# Patient Record
Sex: Female | Born: 1985 | Race: Black or African American | Hispanic: No | Marital: Single | Smoking: Never smoker
Health system: Southern US, Community
[De-identification: ages and names within clinical notes are randomized; demographics above are authoritative.]

## PROBLEM LIST (undated history)

## (undated) DIAGNOSIS — E0590 Thyrotoxicosis, unspecified without thyrotoxic crisis or storm: Secondary | ICD-10-CM

## (undated) HISTORY — DX: Thyrotoxicosis, unspecified without thyrotoxic crisis or storm: E05.90

---

## 2016-08-19 LAB — TSH: TSH: 0 — AB (ref <=5.90)

## 2016-09-23 ENCOUNTER — Encounter: Payer: Self-pay | Admitting: "Endocrinology

## 2016-09-23 ENCOUNTER — Ambulatory Visit (INDEPENDENT_AMBULATORY_CARE_PROVIDER_SITE_OTHER): Payer: BLUE CROSS/BLUE SHIELD | Admitting: "Endocrinology

## 2016-09-23 VITALS — Ht 62.0 in | Wt 148.0 lb

## 2016-09-23 DIAGNOSIS — E0590 Thyrotoxicosis, unspecified without thyrotoxic crisis or storm: Secondary | ICD-10-CM | POA: Diagnosis not present

## 2016-09-23 LAB — TSH: TSH: 0.01 m[IU]/L — AB

## 2016-09-23 LAB — T4, FREE: FREE T4: 1.8 ng/dL (ref 0.8–1.8)

## 2016-09-23 LAB — T3, FREE: T3, Free: 5.3 pg/mL — ABNORMAL HIGH (ref 2.3–4.2)

## 2016-09-23 NOTE — Progress Notes (Signed)
Subjective:    Patient ID: Kara Bruce, female    DOB: 1985/03/19, PCP Jonathon BellowsMcGee, Rachel, DO   Past Medical History:  Diagnosis Date  . Hyperthyroidism    No past surgical history on file. Social History   Social History  . Marital status: Single    Spouse name: N/A  . Number of children: N/A  . Years of education: N/A   Social History Main Topics  . Smoking status: Never Smoker  . Smokeless tobacco: Never Used  . Alcohol use No  . Drug use: No  . Sexual activity: Not Currently    Birth control/ protection: Abstinence   Other Topics Concern  . None   Social History Narrative  . None   Outpatient Encounter Prescriptions as of 09/23/2016  Medication Sig  . Cholecalciferol (VITAMIN D PO) Take by mouth once a week. Vitamin D 1.25mg  Take 1 tablet Once a week  . [DISCONTINUED] Cholecalciferol (VITAMIN D3 PO) Take by mouth.   No facility-administered encounter medications on file as of 09/23/2016.    ALLERGIES: No Known Allergies  VACCINATION STATUS:  There is no immunization history on file for this patient.  HPI 31 year old female patient with medical history as above. She is being seen in consultation for recent abnormal thyroid function tests suggesting hyperthyroidism requested by her PCP Jonathon BellowsMcGee, Rachel, DO. - She was undergoing a routine lab work when she had TSH undetectable, free T3 high at 3.73, free T4 normal at 1.37 on 08/19/2016. - She denies any  recent complaints. She denies weight change, tremors, palpitations, sleep disturbance, heat or cold intolerance. She has oriented of diaphoresis. She denies any family history of thyroid dysfunction.   Review of Systems  Constitutional: no weight gain/loss, no fatigue, no subjective hyperthermia, no subjective hypothermia Eyes: no blurry vision, no xerophthalmia ENT: no sore throat, no nodules palpated in throat, no dysphagia/odynophagia, no hoarseness Cardiovascular: no Chest Pain, no Shortness of Breath, no  palpitations, no leg swelling Respiratory: no cough, no SOB Gastrointestinal: no Nausea/Vomiting/Diarhhea Musculoskeletal: no muscle/joint aches Skin: no rashes Neurological: no tremors, no numbness, no tingling, no dizziness Psychiatric: no depression, no anxiety  Objective:    Ht 5\' 2"  (1.575 m)   Wt 148 lb (67.1 kg)   BMI 27.07 kg/m   Wt Readings from Last 3 Encounters:  09/23/16 148 lb (67.1 kg)    Physical Exam  Constitutional: + Slightly over weight for height, not in acute distress, normal state of mind Eyes: PERRLA, EOMI, no exophthalmos ENT: moist mucous membranes, no thyromegaly, no cervical lymphadenopathy Cardiovascular: normal precordial activity, Regular Rate and Rhythm, no Murmur/Rubs/Gallops Respiratory:  adequate breathing efforts, no gross chest deformity, Clear to auscultation bilaterally Gastrointestinal: abdomen soft, Non -tender, No distension, Bowel Sounds present Musculoskeletal: no gross deformities, strength intact in all four extremities Skin: moist, warm, no rashes Neurological: no tremor with outstretched hands, Deep tendon reflexes normal in all four extremities.    Assessment & Plan:   1. Hyperthyroidism - Patient is being seen at the kind request of her PCP Jonathon Bellowsachel McGee, D.O. - I have reviewed her available thyroid records and clinically evaluated this patient. She did have suppressed TSH and slightly elevated free T3 during a routine lab work approximately one month ago. Patient is not specifically symptomatic of clinical hyperthyroidism at this time, she is remarkably asymptomatic for the given suppression of TSH. -I will proceed to obtain new more complete set of thyroid function test today including TSH, free T4, free T3, antithyroid  antibodies. She will return in 1 week with results. If results are indicative of hyperthyroidism, she will be considered for thyroid uptake and scan. She does not have clinical goiter hence, I did not order any  imaging studies at this time.  - I advised patient to maintain close follow up with Jonathon Bellows, DO for primary care needs. Follow up plan: Return in about 1 week (around 09/30/2016) for labs today.  Marquis Lunch, MD Phone: [phone]  Fax: [phone]   09/23/2016, 2:12 PM

## 2016-09-24 LAB — THYROID PEROXIDASE ANTIBODY: THYROID PEROXIDASE ANTIBODY: 1 [IU]/mL (ref <9)

## 2016-09-24 LAB — THYROGLOBULIN ANTIBODY: Thyroglobulin Ab: <1 IU/mL (ref <2)

## 2016-10-03 ENCOUNTER — Ambulatory Visit (INDEPENDENT_AMBULATORY_CARE_PROVIDER_SITE_OTHER): Payer: BLUE CROSS/BLUE SHIELD | Admitting: "Endocrinology

## 2016-10-03 ENCOUNTER — Encounter: Payer: Self-pay | Admitting: "Endocrinology

## 2016-10-03 VITALS — BP 118/76 | HR 87 | Ht 62.0 in | Wt 151.0 lb

## 2016-10-03 DIAGNOSIS — E0590 Thyrotoxicosis, unspecified without thyrotoxic crisis or storm: Secondary | ICD-10-CM | POA: Diagnosis not present

## 2016-10-03 NOTE — Progress Notes (Signed)
Subjective:    Patient ID: Kara Bruce, female    DOB: [birth year], PCP Jonathon BellowsMcGee, Rachel, DO   Past Medical History:  Diagnosis Date  . Hyperthyroidism    No past surgical history on file. Social History   Social History  . Marital status: Single    Spouse name: N/A  . Number of children: N/A  . Years of education: N/A   Social History Main Topics  . Smoking status: Never Smoker  . Smokeless tobacco: Never Used  . Alcohol use No  . Drug use: No  . Sexual activity: Not Currently    Birth control/ protection: Abstinence   Other Topics Concern  . None   Social History Narrative  . None   Outpatient Encounter Prescriptions as of 10/03/2016  Medication Sig  . Cholecalciferol (VITAMIN D PO) Take by mouth once a week. Vitamin D 1.25mg  Take 1 tablet Once a week   No facility-administered encounter medications on file as of 10/03/2016.    ALLERGIES: No Known Allergies  VACCINATION STATUS:  There is no immunization history on file for this patient.  HPI 31 year old female patient with medical history as above. She is being seen in Follow-up for recent abnormal thyroid function tests suggesting hyperthyroidism. - She was sent for repeat thyroid function tests which show more evidence of hyperthyroidism with elevated free T3 at 5.3, free T4 at 1.8 and suppressed TSH of 0.01.  - She was undergoing a routine lab work when she had TSH undetectable, free T3 high at 3.73, free T4 normal at 1.37 on 08/19/2016. - She complains of mild heat intolerance.  She denies weight change, tremors, palpitations, sleep disturbance.  She denies any family history of thyroid dysfunction.   Review of Systems  Constitutional: + steady weight , no fatigue, no subjective hyperthermia, no subjective hypothermia Eyes: no blurry vision, no xerophthalmia ENT: no sore throat, no nodules palpated in throat, no dysphagia/odynophagia, no hoarseness Cardiovascular: no Chest Pain, no Shortness of  Breath, no palpitations, no leg swelling Respiratory: no cough, no SOB Gastrointestinal: no Nausea/Vomiting/Diarhhea Musculoskeletal: no muscle/joint aches Skin: no rashes Neurological: no tremors, no numbness, no tingling, no dizziness Psychiatric: no depression, no anxiety  Objective:    BP 118/76   Pulse 87   Ht 5\' 2"  (1.575 m)   Wt 151 lb (68.5 kg)   BMI 27.62 kg/m   Wt Readings from Last 3 Encounters:  10/03/16 151 lb (68.5 kg)  09/23/16 148 lb (67.1 kg)    Physical Exam  Constitutional: + Slightly over weight for height, not in acute distress, normal state of mind Eyes: PERRLA, EOMI, no exophthalmos ENT: moist mucous membranes, +Palpable thyromegaly, no cervical lymphadenopathy Cardiovascular: normal precordial activity, Regular Rate and Rhythm, no Murmur/Rubs/Gallops Respiratory:  adequate breathing efforts, no gross chest deformity, Clear to auscultation bilaterally Gastrointestinal: abdomen soft, Non -tender, No distension, Bowel Sounds present Musculoskeletal: no gross deformities, strength intact in all four extremities Skin: moist, warm, no rashes Neurological: no tremor with outstretched hands, Deep tendon reflexes normal in all four extremities.  Recent Results (from the past 2160 hour(s))  TSH     Status: Abnormal   Collection Time: 08/19/16 12:00 AM  Result Value Ref Range   TSH 0.00 (A) 0.41 - 5.90    Comment: Free T4 1.37 normal, free 3  3.73 slightly elevated.  TSH     Status: Abnormal   Collection Time: 09/23/16  2:25 PM  Result Value Ref Range   TSH 0.01 (L) mIU/L  Comment:   Reference Range   > or = 20 Years  0.40-4.50   Pregnancy Range First trimester  0.26-2.66 Second trimester 0.55-2.73 Third trimester  0.43-2.91     Thyroglobulin antibody     Status: None   Collection Time: 09/23/16  2:25 PM  Result Value Ref Range   Thyroglobulin Ab <1 <2 IU/mL  Thyroid peroxidase antibody     Status: None   Collection Time: 09/23/16  2:25 PM   Result Value Ref Range   Thyroperoxidase Ab SerPl-aCnc 1 <9 IU/mL  T4, free     Status: None   Collection Time: 09/23/16  2:25 PM  Result Value Ref Range   Free T4 1.8 0.8 - 1.8 ng/dL  T3, free     Status: Abnormal   Collection Time: 09/23/16  2:25 PM  Result Value Ref Range   T3, Free 5.3 (H) 2.3 - 4.2 pg/mL     Assessment & Plan:   1. Hyperthyroidism - Her repeat thyroid function tests are more suggestive of primary hyperthyroidism. - She will have  thyroid uptake and scan. - Web briefly discussed options of therapy. If she has significantly elevated uptake, she will be considered for I-131 thyroid ablation. - If uptake is not significant, she will be considered for low-dose Tapazole with close follow-up.  - I advised patient to maintain close follow up with Jonathon Bellows, DO for primary care needs. Follow up plan: Return in about 2 weeks (around 10/17/2016) for follow up with thyroid uptake and scan.  Marquis Lunch, MD Phone: [phone]  Fax: [phone]   10/03/2016, 3:17 PM

## 2016-10-14 ENCOUNTER — Encounter (HOSPITAL_COMMUNITY): Payer: Self-pay

## 2016-10-14 ENCOUNTER — Encounter (HOSPITAL_COMMUNITY)
Admission: RE | Admit: 2016-10-14 | Discharge: 2016-10-14 | Disposition: A | Discharge status: Home / Self Care | Payer: BLUE CROSS/BLUE SHIELD | Source: Ambulatory Visit | Attending: "Endocrinology | Admitting: "Endocrinology

## 2016-10-14 DIAGNOSIS — E0590 Thyrotoxicosis, unspecified without thyrotoxic crisis or storm: Secondary | ICD-10-CM | POA: Insufficient documentation

## 2016-10-14 MED ORDER — SODIUM IODIDE I-123 7.4 MBQ CAPS
400.0000 | ORAL_CAPSULE | Freq: Once | ORAL | Status: AC
  Administered 2016-10-14: 366 via ORAL

## 2016-10-15 ENCOUNTER — Encounter (HOSPITAL_COMMUNITY)
Admission: RE | Admit: 2016-10-15 | Discharge: 2016-10-15 | Disposition: A | Discharge status: Home / Self Care | Payer: BLUE CROSS/BLUE SHIELD | Source: Ambulatory Visit | Attending: "Endocrinology | Admitting: "Endocrinology

## 2016-10-15 DIAGNOSIS — E0590 Thyrotoxicosis, unspecified without thyrotoxic crisis or storm: Secondary | ICD-10-CM | POA: Diagnosis not present

## 2016-10-16 ENCOUNTER — Other Ambulatory Visit: Payer: Self-pay | Admitting: "Endocrinology

## 2016-10-16 ENCOUNTER — Ambulatory Visit: Payer: BLUE CROSS/BLUE SHIELD | Admitting: "Endocrinology

## 2016-10-16 DIAGNOSIS — E049 Nontoxic goiter, unspecified: Secondary | ICD-10-CM

## 2016-10-22 ENCOUNTER — Ambulatory Visit (HOSPITAL_COMMUNITY): Payer: BLUE CROSS/BLUE SHIELD

## 2016-10-29 ENCOUNTER — Ambulatory Visit (HOSPITAL_COMMUNITY)
Admission: RE | Admit: 2016-10-29 | Discharge: 2016-10-29 | Disposition: A | Discharge status: Home / Self Care | Payer: BLUE CROSS/BLUE SHIELD | Source: Ambulatory Visit | Attending: "Endocrinology | Admitting: "Endocrinology

## 2016-10-29 DIAGNOSIS — E049 Nontoxic goiter, unspecified: Secondary | ICD-10-CM | POA: Insufficient documentation

## 2016-11-12 ENCOUNTER — Ambulatory Visit (INDEPENDENT_AMBULATORY_CARE_PROVIDER_SITE_OTHER): Payer: BLUE CROSS/BLUE SHIELD | Admitting: "Endocrinology

## 2016-11-12 ENCOUNTER — Encounter: Payer: Self-pay | Admitting: "Endocrinology

## 2016-11-12 VITALS — BP 126/84 | HR 71 | Ht 62.0 in | Wt 147.0 lb

## 2016-11-12 DIAGNOSIS — E0590 Thyrotoxicosis, unspecified without thyrotoxic crisis or storm: Secondary | ICD-10-CM | POA: Diagnosis not present

## 2016-11-12 NOTE — Progress Notes (Signed)
Subjective:    Patient ID: Kara Bruce, female    DOB: [birth year], PCP Jonathon Bellows, DO   Past Medical History:  Diagnosis Date  . Hyperthyroidism    History reviewed. No pertinent surgical history. Social History   Social History  . Marital status: Single    Spouse name: N/A  . Number of children: N/A  . Years of education: N/A   Social History Main Topics  . Smoking status: Never Smoker  . Smokeless tobacco: Never Used  . Alcohol use No  . Drug use: No  . Sexual activity: Not Currently    Birth control/ protection: Abstinence   Other Topics Concern  . None   Social History Narrative  . None   Outpatient Encounter Prescriptions as of 11/12/2016  Medication Sig  . Cholecalciferol (VITAMIN D PO) Take by mouth once a week. Vitamin D 1.25mg  Take 1 tablet Once a week   No facility-administered encounter medications on file as of 11/12/2016.    ALLERGIES: No Known Allergies  VACCINATION STATUS:  There is no immunization history on file for this patient.  HPI 31 year old female patient with medical history as above. She is being seen in Follow-up for recent abnormal thyroid function tests suggesting hyperthyroidism. - She was sent for repeat thyroid function tests which show more evidence of hyperthyroidism with elevated free T3 at 5.3, free T4 at 1.8 and suppressed TSH of 0.01.  - She was undergoing a routine lab work when she had TSH undetectable, free T3 high at 3.73, free T4 normal at 1.37 on 08/19/2016. - She complains of mild heat intolerance. She lost 4 pounds since last visit. - Her thyroid uptake and scan was significant for 55% uptake and some subtle cold areas. Subsequent dedicated thyroid ultrasound did not reveal any nodular lesions.  She denies any family history of thyroid dysfunction.   Review of Systems  Constitutional: +  Weight loss , no fatigue, no subjective hyperthermia, no subjective hypothermia Eyes: no blurry vision, no  xerophthalmia ENT: no sore throat, no nodules palpated in throat, no dysphagia/odynophagia, no hoarseness Cardiovascular: no Chest Pain, no Shortness of Breath, no palpitations, no leg swelling Respiratory: no cough, no SOB Gastrointestinal: no Nausea/Vomiting/Diarhhea Musculoskeletal: no muscle/joint aches Skin: no rashes Neurological: no tremors, no numbness, no tingling, no dizziness Psychiatric: no depression, no anxiety  Objective:    BP 126/84   Pulse 71   Ht  (1.575 m)   Wt 147 lb (66.7 kg)   LMP 10/14/2016   BMI 26.89 kg/m   Wt Readings from Last 3 Encounters:  11/12/16 147 lb (66.7 kg)  10/03/16 151 lb (68.5 kg)  09/23/16 148 lb (67.1 kg)    Physical Exam  Constitutional: + Stable state of mind, not in acute distress, normal state of mind Eyes: PERRLA, EOMI, no exophthalmos ENT: moist mucous membranes, +Palpable thyromegaly, no cervical lymphadenopathy Cardiovascular: normal precordial activity, Regular Rate and Rhythm, no Murmur/Rubs/Gallops Respiratory:  adequate breathing efforts, no gross chest deformity, Clear to auscultation bilaterally Gastrointestinal: abdomen soft, Non -tender, No distension, Bowel Sounds present Musculoskeletal: no gross deformities, strength intact in all four extremities Skin: moist, warm, no rashes Neurological: no tremor with outstretched hands, Deep tendon reflexes normal in all four extremities.  Recent Results (from the past 2160 hour(s))  TSH     Status: Abnormal   Collection Time: 08/19/16 12:00 AM  Result Value Ref Range   TSH 0.00 (A) 0.41 - 5.90    Comment: Free T4 1.37  normal, free 3  3.73 slightly elevated.  TSH     Status: Abnormal   Collection Time: 09/23/16  2:25 PM  Result Value Ref Range   TSH 0.01 (L) mIU/L    Comment:   Reference Range   > or = 20 Years  0.40-4.50   Pregnancy Range First trimester  0.26-2.66 Second trimester 0.55-2.73 Third trimester  0.43-2.91     Thyroglobulin antibody     Status:  None   Collection Time: 09/23/16  2:25 PM  Result Value Ref Range   Thyroglobulin Ab <1 <2 IU/mL  Thyroid peroxidase antibody     Status: None   Collection Time: 09/23/16  2:25 PM  Result Value Ref Range   Thyroperoxidase Ab SerPl-aCnc 1 <9 IU/mL  T4, free     Status: None   Collection Time: 09/23/16  2:25 PM  Result Value Ref Range   Free T4 1.8 0.8 - 1.8 ng/dL  T3, free     Status: Abnormal   Collection Time: 09/23/16  2:25 PM  Result Value Ref Range   T3, Free 5.3 (H) 2.3 - 4.2 pg/mL     Assessment & Plan:   1. Hyperthyroidism - Her repeat thyroid function tests are more suggestive of primary hyperthyroidism, more suggestive of T3 toxicosis - Her thyroid uptake was at 55%. She has negative thyroid ultrasound for nodules. - We briefly discussed options of therapy. she will be considered for I-131 thyroid ablation. This treatment will be scheduled to be administered as soon as possible. - She understands the subsequent need for lifelong thyroid hormone replacement. - She would not need beta blockers at this time.  - I advised patient to maintain close follow up with Jonathon Bellows, DO for primary care needs. Follow up plan: Return in about 8 weeks (around 01/07/2017) for follow up with labs after I131 therapy.  Marquis Lunch, MD Phone: [phone]  Fax: [phone]   11/12/2016, 4:22 PM

## 2016-11-29 ENCOUNTER — Ambulatory Visit (HOSPITAL_COMMUNITY): Admission: RE | Admit: 2016-11-29 | Payer: BLUE CROSS/BLUE SHIELD | Source: Ambulatory Visit

## 2017-01-07 ENCOUNTER — Ambulatory Visit: Payer: BLUE CROSS/BLUE SHIELD | Admitting: "Endocrinology

## 2018-04-22 IMAGING — NM NM THYROID IMAGING W/ UPTAKE SINGLE (24 HR)
4 series · 4 of 4 positions shown · non-contrast
Comparison: None

CLINICAL DATA: Hyperthyroidism

EXAM:
THYROID SCAN AND UPTAKE - 24 HOURS
TECHNIQUE: Following the per oral administration of 8-DV8 sodium iodide, the
patient returned at 24 hours and uptake measurements were acquired
with the uptake probe centered on the neck. Thyroid imaging was also
performed.
RADIOPHARMACEUTICALS:  366 [employer] 8-DV8 sodium iodide orally

[Series 1: ant w marker · 1.18mm/px · 1 of 1 slices shown]
[im 1/1]
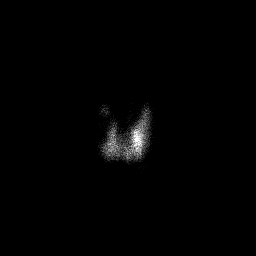

[Series 2: anterior · 1.18mm/px · 1 of 1 slices shown]
[im 1/1]
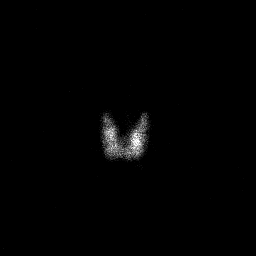

[Series 3: lao · 1.18mm/px · 1 of 1 slices shown]
[im 1/1]
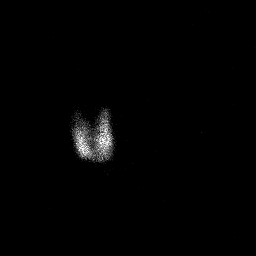

[Series 4: rao · 1.18mm/px · 1 of 1 slices shown]
[im 1/1]
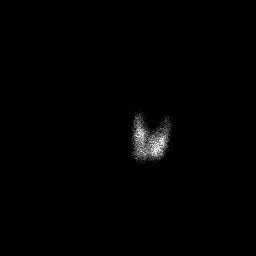

[4 of 4 positions shown; findings below may reference images not displayed]

FINDINGS: 24 hour radio iodine uptake is calculated at 55%, well above the
normal range consistent with hyperthyroidism.

Images of the thyroid gland in 3 projections demonstrate subtle cold
nodules at the upper and mid portions of the RIGHT thyroid lobe.

No definite focal areas of increased tracer localization seen.
IMPRESSION: Elevated 24 hour radio iodine uptakes consistent with
hyperthyroidism.

Subtle cold nodules at the upper and mid RIGHT thyroid lobe ;
thyroid sonography recommended to assess.

## 2018-11-18 IMAGING — US US THYROID
1 series · 13 of 25 positions shown · non-contrast
Comparison: Nuclear medicine thyroid uptake and scan - 10/14/2016

CLINICAL DATA: Incidental on other study. Cold nodule on nuclear
medicine thyroid scan. Hyperthyroidism.

EXAM:
THYROID ULTRASOUND
TECHNIQUE: Ultrasound examination of the thyroid gland and adjacent soft
tissues was performed.

[Series 1: us thyroid · 0.06mm/px · 13 of 62 slices shown]
[im 1/62]
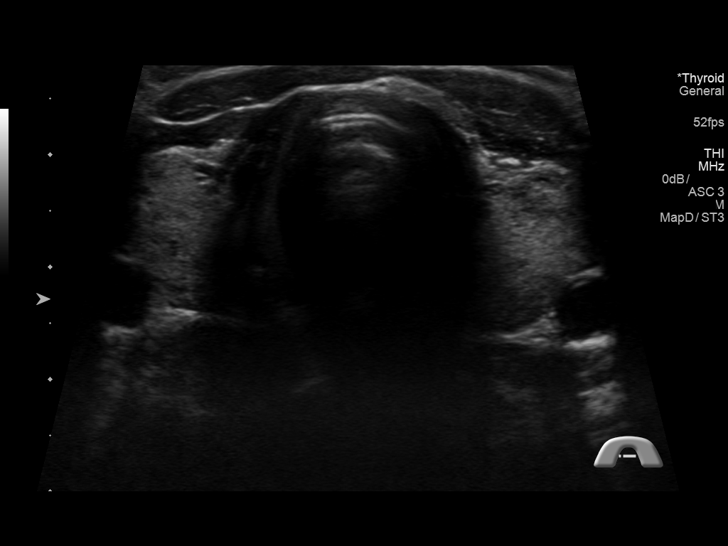
[im 6/62]
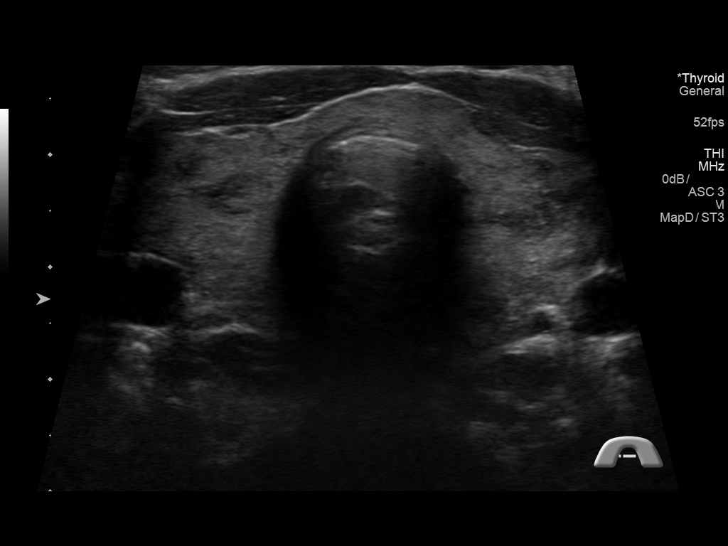
[im 11/62]
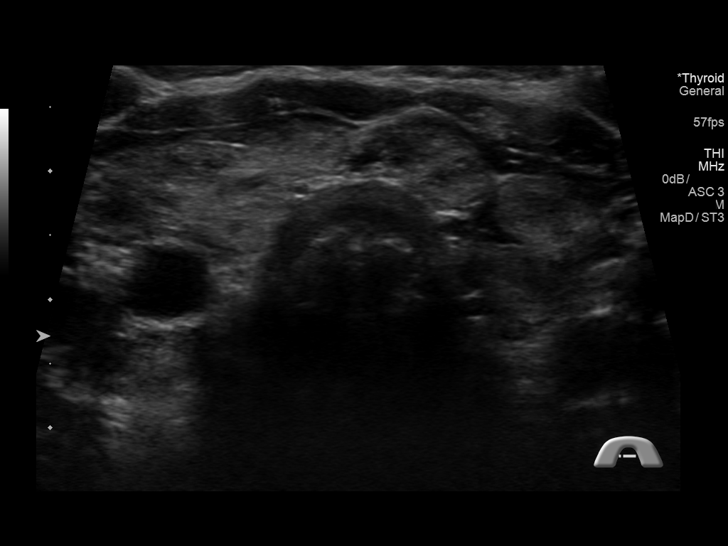
[im 16/62]
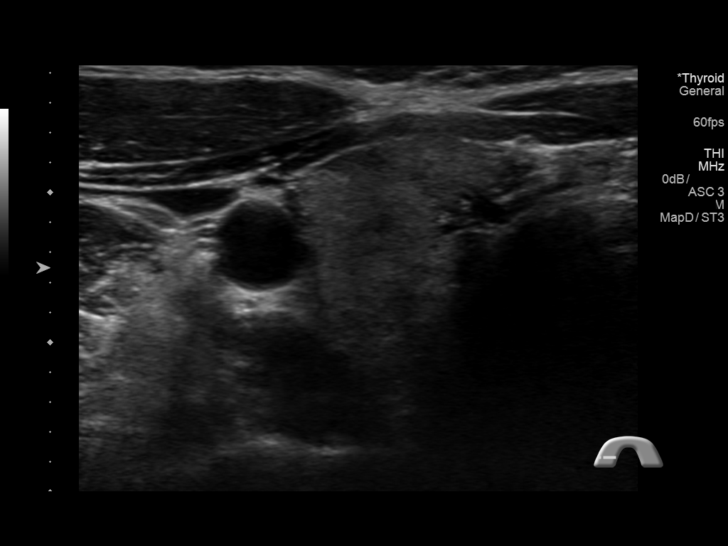
[im 21/62]
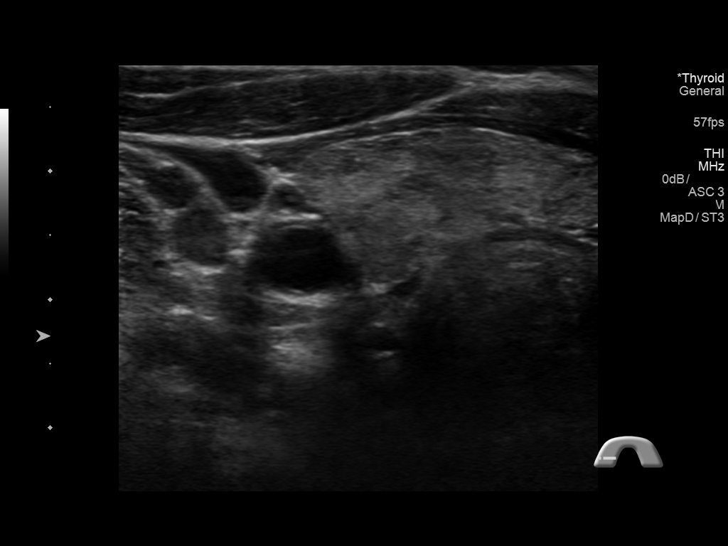
[im 26/62]
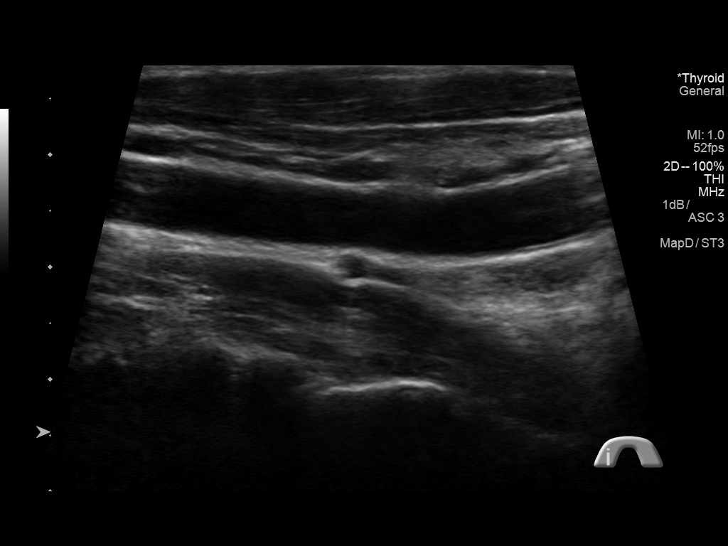
[im 31/62]
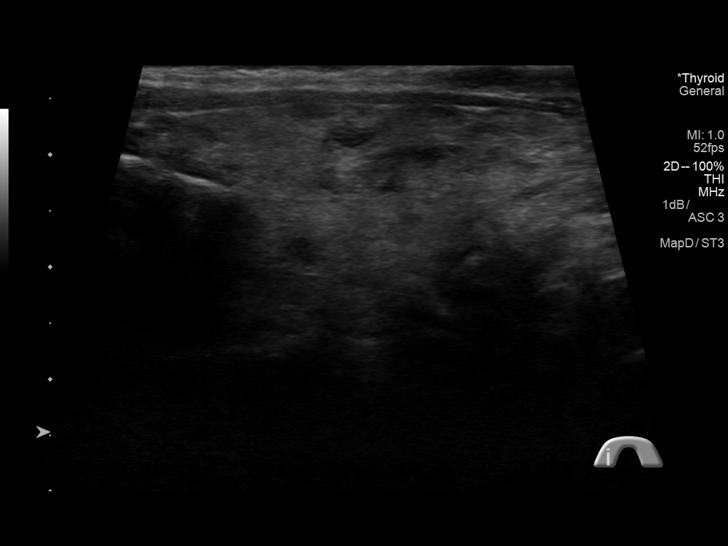
[im 36/62]
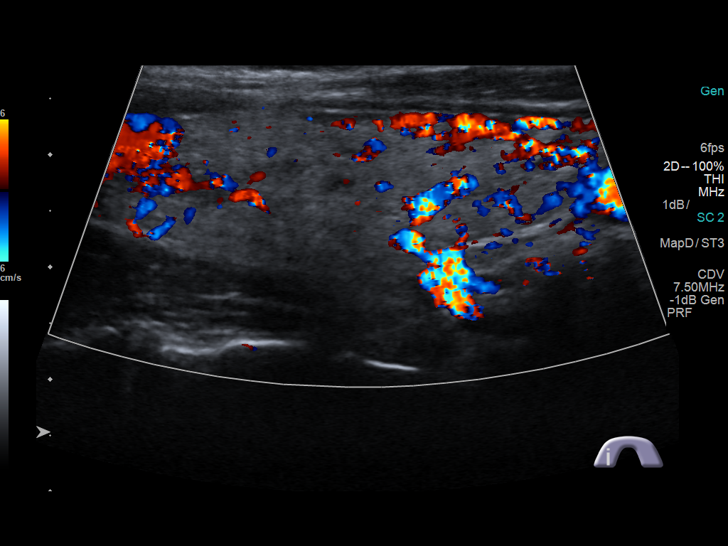
[im 41/62]
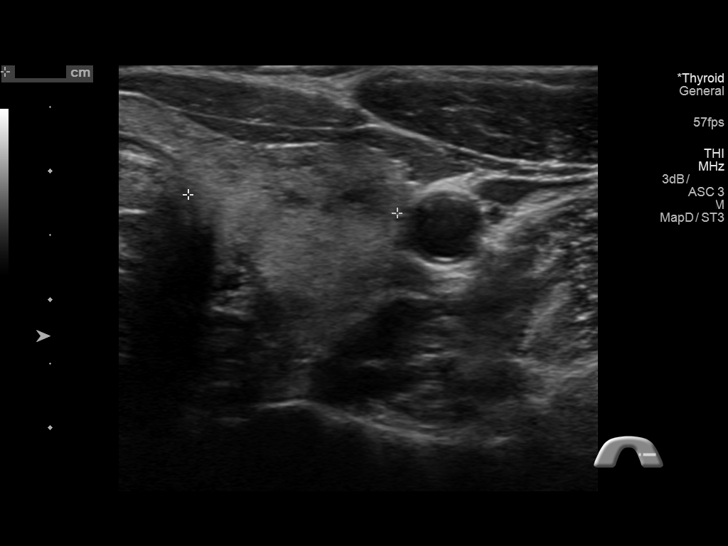
[im 46/62]
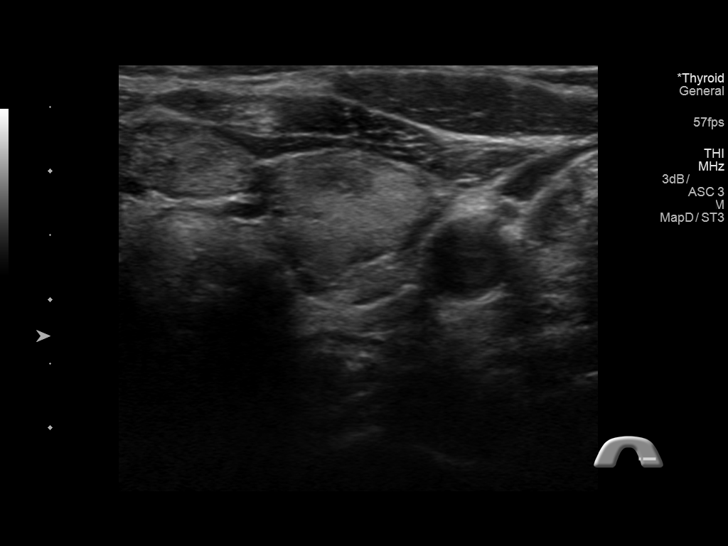
[im 51/62]
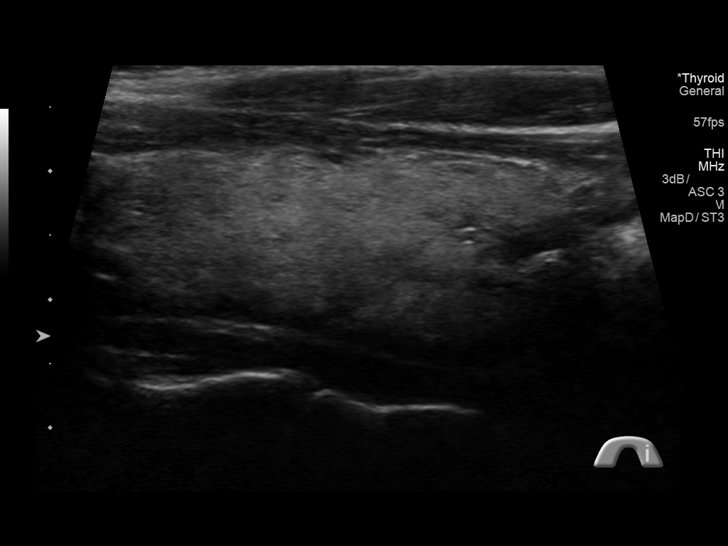
[im 56/62]
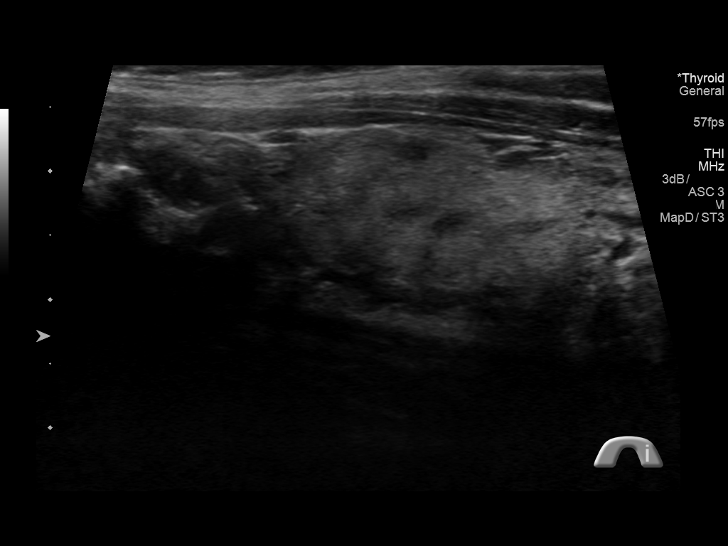
[im 62/62]
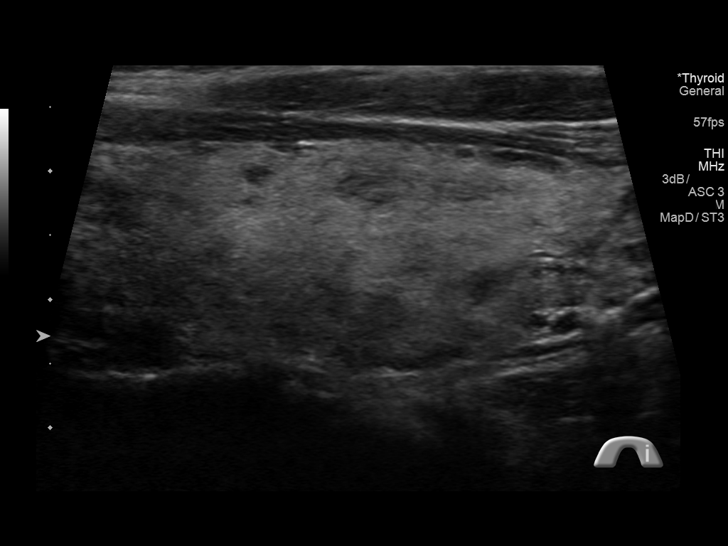

[13 of 25 positions shown; findings below may reference images not displayed]

FINDINGS: Parenchymal Echotexture: Markedly heterogenous - suspected diffuse
glandular hyperemia (images 13, 37 and 62).

Isthmus: Normal in size measures 0.3 cm in diameter

Right lobe: Normal in size measuring 4.9 x 2.0 x 1.6 cm

Left lobe: Normal in size measuring 5.0 x 1.8 x 1.6 cm

_________________________________________________________

Estimated total number of nodules >/= 1 cm: 0

Number of spongiform nodules >/=  2 cm not described below (TR1): 0

Number of mixed cystic and solid nodules >/= 1.5 cm not described
below (TR2): 0

_________________________________________________________

No discrete nodules are seen within the thyroid gland.
IMPRESSION: Marked heterogeneity of the thyroid gland without discrete nodule or
mass. Specifically, there is no definitive sonographic correlate
potential cold nodule within the superior/mid aspect of the right
lobe of the thyroid seen on preceding nuclear medicine thyroid
uptake and scan.

The above is in keeping with the ACR TI-RADS recommendations - [HOSPITAL] 6867;[DATE].

## 2019-03-31 LAB — LIPID PANEL
Cholesterol: 179 (ref 0–200)
HDL: 72 — AB (ref 35–70)
LDL Cholesterol: 92
Triglycerides: 89 (ref 40–160)

## 2019-03-31 LAB — TSH: TSH: 0.01 — AB (ref 0.41–5.90)

## 2019-03-31 LAB — COMPREHENSIVE METABOLIC PANEL: Calcium: 10.3 (ref 8.7–10.7)

## 2019-03-31 LAB — VITAMIN D 25 HYDROXY (VIT D DEFICIENCY, FRACTURES): Vit D, 25-Hydroxy: 6

## 2019-04-22 LAB — TSH: TSH: 0.01 — AB (ref 0.41–5.90)

## 2019-05-24 ENCOUNTER — Other Ambulatory Visit: Payer: Self-pay

## 2019-05-24 ENCOUNTER — Ambulatory Visit (INDEPENDENT_AMBULATORY_CARE_PROVIDER_SITE_OTHER): Payer: PRIVATE HEALTH INSURANCE | Admitting: "Endocrinology

## 2019-05-24 ENCOUNTER — Encounter: Payer: Self-pay | Admitting: "Endocrinology

## 2019-05-24 VITALS — BP 138/86 | HR 73 | Ht 63.0 in | Wt 165.0 lb

## 2019-05-24 DIAGNOSIS — E559 Vitamin D deficiency, unspecified: Secondary | ICD-10-CM | POA: Insufficient documentation

## 2019-05-24 DIAGNOSIS — E0590 Thyrotoxicosis, unspecified without thyrotoxic crisis or storm: Secondary | ICD-10-CM

## 2019-05-24 NOTE — Progress Notes (Signed)
05/24/2019 Endocrinology consult note.  Subjective:    Patient ID: Kara Bruce, female    DOB: [birth year], PCP Jonathon Bellows, DO   Past Medical History:  Diagnosis Date  . Hyperthyroidism    History reviewed. No pertinent surgical history. Social History   Socioeconomic History  . Marital status: Single    Spouse name: Not on file  . Number of children: Not on file  . Years of education: Not on file  . Highest education level: Not on file  Occupational History  . Not on file  Tobacco Use  . Smoking status: Never Smoker  . Smokeless tobacco: Never Used  Substance and Sexual Activity  . Alcohol use: No  . Drug use: No  . Sexual activity: Not Currently    Birth control/protection: Abstinence  Other Topics Concern  . Not on file  Social History Narrative  . Not on file   Social Determinants of Health   Financial Resource Strain:   . Difficulty of Paying Living Expenses:   Food Insecurity:   . Worried About Programme researcher, broadcasting/film/video in the Last Year:   . Barista in the Last Year:   Transportation Needs:   . Freight forwarder (Medical):   Marland Kitchen Lack of Transportation (Non-Medical):   Physical Activity:   . Days of Exercise per Week:   . Minutes of Exercise per Session:   Stress:   . Feeling of Stress :   Social Connections:   . Frequency of Communication with Friends and Family:   . Frequency of Social Gatherings with Friends and Family:   . Attends Religious Services:   . Active Member of Clubs or Organizations:   . Attends Banker Meetings:   Marland Kitchen Marital Status:    Outpatient Encounter Medications as of 05/24/2019  Medication Sig  . FERRETTS 325 (106 Fe) MG TABS tablet Take 1 tablet by mouth daily.  . methimazole (TAPAZOLE) 5 MG tablet Take 5 mg by mouth every 8 (eight) hours.  . [DISCONTINUED] Cholecalciferol (VITAMIN D PO) Take by mouth once a week. Vitamin D 1.25mg  Take 1 tablet Once a week   No facility-administered encounter  medications on file as of 05/24/2019.   ALLERGIES: No Known Allergies  VACCINATION STATUS:  There is no immunization history on file for this patient.  HPI 34 year old female patient with medical history as above. She was seen in this clinic 3 years ago in consult for hyperthyroidism.  Her work-up was significant for primary hyperthyroidism for which RAI treatment was advised and ordered.  However, patient did not complete this treatment and did not show up for follow-up visit.   She is rereferred due to the fact that her recent thyroid function tests from February and March revealed persistently suppressed TSH and high normal free T4. She was started on methimazole by her PCP .  She actually gained some weight since last visit, reports on and off palpitations.    Remarkably, she was only minimally symptomatic even before when her thyroid uptake and scan was elevated at 55%. -Her prior thyroid ultrasound did not reveal any nodular lesions.  She denies any family history of thyroid dysfunction.   Review of Systems  Constitutional: +  Weight gain , + fatigue, + subjective hyperthermia, no subjective hypothermia Eyes: no blurry vision, no xerophthalmia ENT: no sore throat, no nodules palpated in throat, no dysphagia/odynophagia, no hoarseness Cardiovascular: no Chest Pain, no Shortness of Breath, no palpitations, no leg swelling Respiratory: no cough,  no SOB Gastrointestinal: no Nausea/Vomiting/Diarhhea Musculoskeletal: no muscle/joint aches Skin: no rashes Neurological: no tremors, no numbness, no tingling, no dizziness Psychiatric: no depression, no anxiety  Objective:    BP 138/86   Pulse 73   Ht 5\' 3"  (1.6 m)   Wt 165 lb (74.8 kg)   BMI 29.23 kg/m   Wt Readings from Last 3 Encounters:  05/24/19 165 lb (74.8 kg)  11/12/16 147 lb (66.7 kg)  10/03/16 151 lb (68.5 kg)    Physical Exam  Physical Exam- Limited  Constitutional:  Body mass index is 29.23 kg/m. , not in  acute distress, normal state of mind Eyes:  EOMI, no exophthalmos Neck: Supple Thyroid: + Palpable thyromegaly.   Respiratory: Adequate breathing efforts Musculoskeletal: no gross deformities, strength intact in all four extremities, no gross restriction of joint movements Skin:  no rashes, no hyperemia Neurological: + tremor with outstretched hands,    Recent Results (from the past 2160 hour(s))  TSH     Status: Abnormal   Collection Time: 04/22/19 12:00 AM  Result Value Ref Range   TSH 0.01 (A) 0.41 - 5.90     Assessment & Plan:   1. Hyperthyroidism 2. Vitamin D Deficiency - Her presenting thyroid function tests are still consistent with hyperthyroidism.  Her prior thyroid uptake and scan is not evaluated any longer.  She is approached for confirmatory study with thyroid uptake and scan.  She will return in 7-10 days for treatment decision.  I briefly discussed treatment options including I-131 thyroid ablation if she returns with significant uptake.      - She understands the subsequent need for lifelong thyroid hormone replacement. -Her pulse rate is 73, did not require beta-blocker.    She would not need beta blockers at this time. She is advised to discontinue her methimazole for the scan and therapy with I 131 if necessary.  She will need vitamin D therapy with vitamin D2 50,000 units weekly for 12 weeks.  - I advised patient to maintain close follow up with Sherrilee Gilles, DO for primary care needs.  - Time spent on this patient care encounter:  35 min, of which >50% was spent in  counseling and the rest reviewing her  current and  previous labs/studies ( including abstraction from other facilities),  previous treatments,  and medications' doses and developing a plan for long-term care based on the latest recommendations for standards of care; and documenting her care.  Dalma Speelman participated in the discussions, expressed understanding, and voiced agreement with the  above plans.  All questions were answered to her satisfaction. she is encouraged to contact clinic should she have any questions or concerns prior to her return visit.  Follow up plan: Return in about 10 days (around 06/03/2019) for Follow up with Thyroid Uptake and Scan.  Glade Lloyd, MD Phone: [phone]  Fax: [phone]   05/24/2019, 8:05 PM

## 2019-05-25 MED ORDER — VITAMIN D (ERGOCALCIFEROL) 1.25 MG (50000 UNIT) PO CAPS: 50000.0000 [IU] | ORAL_CAPSULE | ORAL | 0 refills | Status: DC

## 2019-06-03 ENCOUNTER — Ambulatory Visit (HOSPITAL_COMMUNITY): Payer: Medicaid - Out of State

## 2019-06-03 ENCOUNTER — Other Ambulatory Visit (HOSPITAL_COMMUNITY): Payer: BLUE CROSS/BLUE SHIELD

## 2019-06-04 ENCOUNTER — Other Ambulatory Visit (HOSPITAL_COMMUNITY): Payer: BLUE CROSS/BLUE SHIELD

## 2019-06-08 ENCOUNTER — Encounter: Payer: Self-pay | Admitting: *Deleted

## 2019-06-08 ENCOUNTER — Ambulatory Visit: Payer: PRIVATE HEALTH INSURANCE | Admitting: "Endocrinology

## 2019-06-14 ENCOUNTER — Telehealth: Payer: Self-pay | Admitting: "Endocrinology

## 2019-06-14 NOTE — Telephone Encounter (Signed)
Pt called that she received a letter in the mail that she was scheduled for uptake and scan at [facility]. She said she was unaware of that because she was scheduled at [facility]. I called [facility] and canceled per pt request.

## 2019-06-25 NOTE — Telephone Encounter (Signed)
Will call pt's insurance for prior authorization.

## 2019-06-25 NOTE — Telephone Encounter (Signed)
[place] [facility] called and pt needs PA on this scan that is 5/11 of next week , her number is [phone] ext is [phone]

## 2019-06-29 ENCOUNTER — Encounter (HOSPITAL_COMMUNITY): Payer: PRIVATE HEALTH INSURANCE

## 2019-06-29 ENCOUNTER — Ambulatory Visit (HOSPITAL_COMMUNITY)
Admission: RE | Admit: 2019-06-29 | Discharge: 2019-06-29 | Disposition: A | Discharge status: Home / Self Care | Payer: PRIVATE HEALTH INSURANCE | Source: Ambulatory Visit | Attending: "Endocrinology | Admitting: "Endocrinology

## 2019-06-29 ENCOUNTER — Other Ambulatory Visit: Payer: Self-pay

## 2019-06-29 DIAGNOSIS — E0590 Thyrotoxicosis, unspecified without thyrotoxic crisis or storm: Secondary | ICD-10-CM

## 2019-06-29 MED ORDER — SODIUM IODIDE I-123 7.4 MBQ CAPS
452.0000 | ORAL_CAPSULE | Freq: Once | ORAL | Status: AC
  Administered 2019-06-29: 10:00:00 452 via ORAL

## 2019-06-30 ENCOUNTER — Encounter (HOSPITAL_COMMUNITY): Payer: PRIVATE HEALTH INSURANCE

## 2019-06-30 ENCOUNTER — Encounter (HOSPITAL_COMMUNITY)
Admission: RE | Admit: 2019-06-30 | Discharge: 2019-06-30 | Disposition: A | Discharge status: Home / Self Care | Payer: PRIVATE HEALTH INSURANCE | Source: Ambulatory Visit | Attending: "Endocrinology | Admitting: "Endocrinology

## 2019-07-05 ENCOUNTER — Encounter: Payer: Self-pay | Admitting: "Endocrinology

## 2019-07-05 ENCOUNTER — Telehealth (INDEPENDENT_AMBULATORY_CARE_PROVIDER_SITE_OTHER): Payer: PRIVATE HEALTH INSURANCE | Admitting: "Endocrinology

## 2019-07-05 DIAGNOSIS — E0500 Thyrotoxicosis with diffuse goiter without thyrotoxic crisis or storm: Secondary | ICD-10-CM | POA: Insufficient documentation

## 2019-07-05 DIAGNOSIS — E0590 Thyrotoxicosis, unspecified without thyrotoxic crisis or storm: Secondary | ICD-10-CM

## 2019-07-05 MED ORDER — PROPRANOLOL HCL 20 MG PO TABS: 20.0000 mg | ORAL_TABLET | Freq: Two times a day (BID) | ORAL | 2 refills | Status: DC

## 2019-07-05 NOTE — Progress Notes (Signed)
07/05/2019 Patient was engaged in follow-up for my chart video chat.                                Endocrinology Telehealth Visit Follow up Note -During COVID -19 Pandemic  I connected with Kara Bruce on 07/05/2019   by  video and verified that I am speaking with the correct person using two identifiers. Kara Bruce, Kara Bruce. she has verbally consented to this visit. All issues noted in this document were discussed and addressed. The format was not optimal for physical exam.   Subjective:    Patient ID: Kara Bruce, female    DOB: [birth year], PCP Kara Bruce, Kara Bruce   Past Medical History:  Diagnosis Date  . Hyperthyroidism    History reviewed. No pertinent surgical history. Social History   Socioeconomic History  . Marital status: Single    Spouse name: Not on file  . Number of children: Not on file  . Years of education: Not on file  . Highest education level: Not on file  Occupational History  . Not on file  Tobacco Use  . Smoking status: Never Smoker  . Smokeless tobacco: Never Used  Substance and Sexual Activity  . Alcohol use: No  . Drug use: No  . Sexual activity: Not Currently    Birth control/protection: Abstinence  Other Topics Concern  . Not on file  Social History Narrative  . Not on file   Social Determinants of Health   Financial Resource Strain:   . Difficulty of Paying Living Expenses:   Food Insecurity:   . Worried About Programme researcher, broadcasting/film/video in the Last Year:   . Barista in the Last Year:   Transportation Needs:   . Freight forwarder (Medical):   Marland Kitchen Lack of Transportation (Non-Medical):   Physical Activity:   . Days of Exercise per Week:   . Minutes of Exercise per Session:   Stress:   . Feeling of Stress :   Social Connections:   . Frequency of Communication with Friends and Family:   . Frequency of Social Gatherings with Friends and Family:   . Attends Religious Services:   . Active Member of Clubs or  Organizations:   . Attends Banker Meetings:   Marland Kitchen Marital Status:    Outpatient Encounter Medications as of 07/05/2019  Medication Sig  . FERRETTS 325 (106 Fe) MG TABS tablet Take 1 tablet by mouth daily.  . propranolol (INDERAL) 20 MG tablet Take 1 tablet (20 mg total) by mouth 2 (two) times daily.  . Vitamin D, Ergocalciferol, (DRISDOL) 1.25 MG (50000 UNIT) CAPS capsule Take 1 capsule (50,000 Units total) by mouth every 7 (seven) days.   No facility-administered encounter medications on file as of 07/05/2019.   ALLERGIES: No Known Allergies  VACCINATION STATUS:  There is no immunization history on file for this patient.  HPI 34 year old female patient with medical history as above. She was seen in this clinic 3 years ago in consult for hyperthyroidism.  Her work-up was significant for primary hyperthyroidism for which RAI treatment was advised and ordered.  However, patient did not complete this treatment and did not show up for follow-up until her most recent visit.  She was sent for repeat thyroid uptake and scan which confirms primary hyperthyroidism with Graves' disease.  She is now reporting palpitations on and off and heat intolerance.  She is not on  any antithyroid medication at this time.  Remarkably, she was only minimally symptomatic even before when her thyroid uptake and scan was elevated at 55%, her most recent uptake is 67% uniform uptake suggesting Graves' disease on Jun 30, 2019. -Her prior thyroid ultrasound did not reveal any nodular lesions.  She denies any family history of thyroid dysfunction.   Review of Systems  Limited as above.  Objective:    There were no vitals taken for this visit.  Wt Readings from Last 3 Encounters:  05/24/19 165 lb (74.8 kg)  11/12/16 147 lb (66.7 kg)  10/03/16 151 lb (68.5 kg)    Physical Exam    Recent Results (from the past 2160 hour(s))  TSH     Status: Abnormal   Collection Time: 04/22/19 12:00 AM  Result  Value Ref Range   TSH 0.01 (A) 0.41 - 5.90    Thyroid uptake and scan Jun 30, 2019 FINDINGS: Homogeneous tracer distribution in both thyroid lobes.  No focal areas of increased or decreased tracer localization seen.  Faint pyramidal lobe noted.  4 hour I-123 uptake = 45% (normal 5-20%)  24 hour I-123 uptake = 67% (normal 10-30%)  IMPRESSION: Elevated 4 hour and 24 hour radio iodine uptakes consistent with Hyperthyroidism.  Normal thyroid scan.  Findings consistent with Graves disease.  Assessment & Plan:   1. Hyperthyroidism 2.  Graves' disease  3.  Vitamin D Deficiency - Her presenting thyroid function tests and subsequent thyroid function tests are consistent with primary hyperthyroidism from Graves' disease.    She was previously treated with methimazole with suboptimal success.  I had a long discussion with her about the need for definitive treatment.  Options were discussed with her.  Her best option at this time with radioactive iodine thyroid ablation.  This treatment will be ordered to be administered as soon as possible in [facility].    - She understands the subsequent need for lifelong thyroid hormone replacement. -Due to her recent symptoms of palpitations and heat intolerance, she will be given a brief course of beta-blocker, propranolol 20 mg p.o. twice daily.   She is advised to stay away from methimazole. She was recently initiated on vitamin D therapy.  She is advised to continue V D2 50,000 units weekly for 12 weeks.  - I advised patient to maintain close follow up with Kara Bruce, Kara Bruce for primary care needs.     - Time spent on this patient care encounter:  25 minutes of which 50% was spent in  counseling and the rest reviewing  her current and  previous labs / studies and medications  doses and developing a plan for long term care. Kara Bruce  participated in the discussions, expressed understanding, and voiced agreement with the above  plans.  All questions were answered to her satisfaction. she is encouraged to contact clinic should she have any questions or concerns prior to her return visit.   Follow up plan: Return in about 9 weeks (around 09/06/2019) for F/U with Labs after I131 Therapy.  Glade Lloyd, MD Phone: [phone]  Fax: [phone]   07/05/2019, 6:27 PM

## 2019-07-13 ENCOUNTER — Other Ambulatory Visit: Payer: Self-pay

## 2019-07-13 ENCOUNTER — Ambulatory Visit: Payer: PRIVATE HEALTH INSURANCE | Admitting: "Endocrinology

## 2019-07-13 DIAGNOSIS — E0590 Thyrotoxicosis, unspecified without thyrotoxic crisis or storm: Secondary | ICD-10-CM

## 2019-07-15 ENCOUNTER — Encounter (HOSPITAL_COMMUNITY): Payer: Medicaid Other

## 2019-08-03 ENCOUNTER — Other Ambulatory Visit: Payer: Self-pay

## 2019-08-03 DIAGNOSIS — E0500 Thyrotoxicosis with diffuse goiter without thyrotoxic crisis or storm: Secondary | ICD-10-CM

## 2019-08-05 ENCOUNTER — Ambulatory Visit (HOSPITAL_COMMUNITY)
Admission: RE | Admit: 2019-08-05 | Discharge: 2019-08-05 | Disposition: A | Discharge status: Home / Self Care | Payer: PRIVATE HEALTH INSURANCE | Source: Ambulatory Visit | Attending: "Endocrinology | Admitting: "Endocrinology

## 2019-08-05 ENCOUNTER — Other Ambulatory Visit (HOSPITAL_COMMUNITY)
Admission: RE | Admit: 2019-08-05 | Discharge: 2019-08-05 | Disposition: A | Discharge status: Home / Self Care | Payer: PRIVATE HEALTH INSURANCE | Source: Ambulatory Visit | Attending: "Endocrinology | Admitting: "Endocrinology

## 2019-08-05 DIAGNOSIS — E0500 Thyrotoxicosis with diffuse goiter without thyrotoxic crisis or storm: Secondary | ICD-10-CM | POA: Insufficient documentation

## 2019-08-05 DIAGNOSIS — E0590 Thyrotoxicosis, unspecified without thyrotoxic crisis or storm: Secondary | ICD-10-CM | POA: Insufficient documentation

## 2019-08-05 LAB — HCG, SERUM, QUALITATIVE: Preg, Serum: NEGATIVE

## 2019-08-05 LAB — TSH: TSH: <0.01 u[IU]/mL — ABNORMAL LOW (ref 0.350–4.500)

## 2019-08-05 LAB — T4, FREE: Free T4: 1.44 ng/dL — ABNORMAL HIGH (ref 0.61–1.12)

## 2019-08-05 MED ORDER — SODIUM IODIDE I 131 CAPSULE
12.0000 | Freq: Once | INTRAVENOUS | Status: AC | PRN
  Administered 2019-08-05: 12 via ORAL

## 2019-09-08 ENCOUNTER — Ambulatory Visit: Payer: PRIVATE HEALTH INSURANCE | Admitting: "Endocrinology

## 2019-09-28 LAB — TSH: TSH: 0.01 — AB (ref 0.41–5.90)

## 2019-10-12 ENCOUNTER — Other Ambulatory Visit: Payer: Self-pay

## 2019-10-12 ENCOUNTER — Encounter: Payer: Self-pay | Admitting: "Endocrinology

## 2019-10-12 ENCOUNTER — Ambulatory Visit (INDEPENDENT_AMBULATORY_CARE_PROVIDER_SITE_OTHER): Payer: PRIVATE HEALTH INSURANCE | Admitting: "Endocrinology

## 2019-10-12 VITALS — BP 108/76 | HR 68 | Ht 63.0 in | Wt 164.8 lb

## 2019-10-12 DIAGNOSIS — E0590 Thyrotoxicosis, unspecified without thyrotoxic crisis or storm: Secondary | ICD-10-CM | POA: Diagnosis not present

## 2019-10-12 NOTE — Progress Notes (Signed)
10/12/2019 Endocrinology consult note.  Subjective:    Patient ID: Kara Bruce, female    DOB: [birth year], PCP Jonathon Bellows, DO   Past Medical History:  Diagnosis Date  . Hyperthyroidism    History reviewed. No pertinent surgical history. Social History   Socioeconomic History  . Marital status: Single    Spouse name: Not on file  . Number of children: Not on file  . Years of education: Not on file  . Highest education level: Not on file  Occupational History  . Not on file  Tobacco Use  . Smoking status: Never Smoker  . Smokeless tobacco: Never Used  Vaping Use  . Vaping Use: Never used  Substance and Sexual Activity  . Alcohol use: No  . Drug use: No  . Sexual activity: Not Currently    Birth control/protection: Abstinence  Other Topics Concern  . Not on file  Social History Narrative  . Not on file   Social Determinants of Health   Financial Resource Strain:   . Difficulty of Paying Living Expenses: Not on file  Food Insecurity:   . Worried About Programme researcher, broadcasting/film/video in the Last Year: Not on file  . Ran Out of Food in the Last Year: Not on file  Transportation Needs:   . Lack of Transportation (Medical): Not on file  . Lack of Transportation (Non-Medical): Not on file  Physical Activity:   . Days of Exercise per Week: Not on file  . Minutes of Exercise per Session: Not on file  Stress:   . Feeling of Stress : Not on file  Social Connections:   . Frequency of Communication with Friends and Family: Not on file  . Frequency of Social Gatherings with Friends and Family: Not on file  . Attends Religious Services: Not on file  . Active Member of Clubs or Organizations: Not on file  . Attends Banker Meetings: Not on file  . Marital Status: Not on file   Outpatient Encounter Medications as of 10/12/2019  Medication Sig  . FERRETTS 325 (106 Fe) MG TABS tablet Take 1 tablet by mouth daily.  . Vitamin D, Ergocalciferol, (DRISDOL) 1.25 MG  (50000 UNIT) CAPS capsule Take 1 capsule (50,000 Units total) by mouth every 7 (seven) days.  . [DISCONTINUED] propranolol (INDERAL) 20 MG tablet Take 1 tablet (20 mg total) by mouth 2 (two) times daily.   No facility-administered encounter medications on file as of 10/12/2019.   ALLERGIES: No Known Allergies  VACCINATION STATUS:  There is no immunization history on file for this patient.  HPI 34 year old female patient with medical history as above. She is dealt with hyperthyroidism from Graves' disease for 3+ years. During her last visit, she agreed and was sent for radioactive iodine ablation. She received this treatment on August 05, 2019. She underwent post therapy thyroid function test on September 28, 2019. Labs are consistent with treatment effect, however no hypothyroidism yet. She remains on propanolol, not on methimazole. She reports feeling better, regaining some of the weight she lost when she was hyperthyroid.  She denies palpitations, tremors, heat/cold intolerance.   Remarkably, she was only minimally symptomatic even before when her thyroid uptake and scan was elevated at 55%. -Her prior thyroid ultrasound did not reveal any nodular lesions.  She denies any family history of thyroid dysfunction.   Review of Systems  Constitutional: +  Weight gain , + fatigue, + subjective hyperthermia, no subjective hypothermia Eyes: no blurry vision, no xerophthalmia ENT:  no sore throat, no nodules palpated in throat, no dysphagia/odynophagia, no hoarseness Cardiovascular: no Chest Pain, no Shortness of Breath, no palpitations, no leg swelling Respiratory: no cough, no SOB Gastrointestinal: no Nausea/Vomiting/Diarhhea Musculoskeletal: no muscle/joint aches Skin: no rashes Neurological: no tremors, no numbness, no tingling, no dizziness Psychiatric: no depression, no anxiety  Objective:    BP 108/76   Pulse 68   Ht 5\' 3"  (1.6 m)   Wt 164 lb 12.8 oz (74.8 kg)   BMI 29.19 kg/m   Wt  Readings from Last 3 Encounters:  10/12/19 164 lb 12.8 oz (74.8 kg)  05/24/19 165 lb (74.8 kg)  11/12/16 147 lb (66.7 kg)     Physical Exam- Limited  Constitutional:  Body mass index is 29.19 kg/m. , not in acute distress, normal state of mind Eyes:  EOMI, no exophthalmos Neck: Supple Thyroid: No gross goiter Respiratory: Adequate breathing efforts Musculoskeletal: no gross deformities, strength intact in all four extremities, no gross restriction of joint movements Skin:  no rashes, no hyperemia Neurological: no tremor with outstretched hands    Recent Results (from the past 2160 hour(s))  hCG, serum, qualitative     Status: None   Collection Time: 08/05/19  9:05 AM  Result Value Ref Range   Preg, Serum NEGATIVE NEGATIVE    Comment:        THE SENSITIVITY OF THIS METHODOLOGY IS >10 mIU/mL. Performed at [facility], [address] [place], [place]   T4, free     Status: Abnormal   Collection Time: 08/05/19  9:05 AM  Result Value Ref Range   Free T4 1.44 (H) 0.61 - 1.12 ng/dL    Comment: (NOTE) Biotin ingestion may interfere with free T4 tests. If the results are inconsistent with the TSH level, previous test results, or the clinical presentation, then consider biotin interference. If needed, order repeat testing after stopping biotin. Performed at [facility], [address] [place], [place]   TSH     Status: Abnormal   Collection Time: 08/05/19  9:05 AM  Result Value Ref Range   TSH <0.010 (L) 0.350 - 4.500 uIU/mL    Comment: Performed at [facility], [address] [place], [place]     Assessment & Plan:   1. Hyperthyroidism 2. Vitamin D Deficiency -She presents with treatment effect with clinical improvement, status post radioactive iodine ablation on August 05, 2019. Her previsit labs are not consistent with hypothyroidism yet. She would not be initiated on thyroid hormone replacement today.    - She understands the high  likelihood of subsequent hypothyroidism and need for lifelong thyroid hormone replacement. -Her pulse rate is 68, she is advised to discontinue her propanolol.   She is advised to continue  vitamin D therapy with vitamin D2 50,000 units weekly for 12 weeks.  - I advised patient to maintain close follow up with August 07, 2019, DO for primary care needs.     - Time spent on this patient care encounter:  20 minutes of which 50% was spent in  counseling and the rest reviewing  her current and  previous labs / studies and medications  doses and developing a plan for long term care. Aquita Scarberry  participated in the discussions, expressed understanding, and voiced agreement with the above plans.  All questions were answered to her satisfaction. she is encouraged to contact clinic should she have any questions or concerns prior to her return visit.   Follow up  plan: Return in about 5 weeks (around 11/16/2019) for F/U with Pre-visit Labs.  Marquis Lunch, MD Phone: [phone]  Fax: [phone]   10/12/2019, 9:02 AM

## 2019-11-12 LAB — T4, FREE: Free T4: 0.84 ng/dL (ref 0.82–1.77)

## 2019-11-12 LAB — TSH: TSH: 0.026 u[IU]/mL — ABNORMAL LOW (ref 0.450–4.500)

## 2019-11-12 LAB — T3, FREE: T3, Free: 2.2 pg/mL (ref 2.0–4.4)

## 2019-11-16 ENCOUNTER — Other Ambulatory Visit: Payer: Self-pay

## 2019-11-16 ENCOUNTER — Ambulatory Visit (INDEPENDENT_AMBULATORY_CARE_PROVIDER_SITE_OTHER): Payer: PRIVATE HEALTH INSURANCE | Admitting: "Endocrinology

## 2019-11-16 ENCOUNTER — Encounter: Payer: Self-pay | Admitting: "Endocrinology

## 2019-11-16 VITALS — BP 116/82 | HR 64 | Ht 63.0 in | Wt 167.8 lb

## 2019-11-16 DIAGNOSIS — E0590 Thyrotoxicosis, unspecified without thyrotoxic crisis or storm: Secondary | ICD-10-CM | POA: Diagnosis not present

## 2019-11-16 NOTE — Progress Notes (Signed)
11/16/2019 Endocrinology consult note.  Subjective:    Patient ID: Kara Bruce, female    DOB: 09/24/1985, PCP Jonathon Bellows, DO   Past Medical History:  Diagnosis Date  . Hyperthyroidism    History reviewed. No pertinent surgical history. Social History   Socioeconomic History  . Marital status: Single    Spouse name: Not on file  . Number of children: Not on file  . Years of education: Not on file  . Highest education level: Not on file  Occupational History  . Not on file  Tobacco Use  . Smoking status: Never Smoker  . Smokeless tobacco: Never Used  Vaping Use  . Vaping Use: Never used  Substance and Sexual Activity  . Alcohol use: No  . Drug use: No  . Sexual activity: Not Currently    Birth control/protection: Abstinence  Other Topics Concern  . Not on file  Social History Narrative  . Not on file   Social Determinants of Health   Financial Resource Strain:   . Difficulty of Paying Living Expenses: Not on file  Food Insecurity:   . Worried About Programme researcher, broadcasting/film/video in the Last Year: Not on file  . Ran Out of Food in the Last Year: Not on file  Transportation Needs:   . Lack of Transportation (Medical): Not on file  . Lack of Transportation (Non-Medical): Not on file  Physical Activity:   . Days of Exercise per Week: Not on file  . Minutes of Exercise per Session: Not on file  Stress:   . Feeling of Stress : Not on file  Social Connections:   . Frequency of Communication with Friends and Family: Not on file  . Frequency of Social Gatherings with Friends and Family: Not on file  . Attends Religious Services: Not on file  . Active Member of Clubs or Organizations: Not on file  . Attends Banker Meetings: Not on file  . Marital Status: Not on file   Outpatient Encounter Medications as of 11/16/2019  Medication Sig  . FERRETTS 325 (106 Fe) MG TABS tablet Take 1 tablet by mouth daily.  . Vitamin D, Ergocalciferol, (DRISDOL) 1.25 MG  (50000 UNIT) CAPS capsule Take 1 capsule (50,000 Units total) by mouth every 7 (seven) days.   No facility-administered encounter medications on file as of 11/16/2019.   ALLERGIES: No Known Allergies  VACCINATION STATUS:  There is no immunization history on file for this patient.  HPI 34 year old female patient with medical history as above. She is here to follow-up with repeat thyroid function tests.  She has dealt with hyperthyroidism from Graves' disease for 3+ years. During her last visit, she agreed and was sent for radioactive iodine ablation. She received this treatment on August 05, 2019. She underwent post therapy thyroid function test on September 28, 2019. Labs are consistent with treatment effect, however no hypothyroidism yet. She remains off of beta-blocker.   She reports feeling better, regaining some of the weight she lost when she was hyperthyroid.  She denies palpitations, tremors, heat/cold intolerance.   Remarkably, she was only minimally symptomatic even before when her thyroid uptake and scan was elevated at 55%. -Her prior thyroid ultrasound did not reveal any nodular lesions.  She denies any family history of thyroid dysfunction.   Review of Systems  Weight gain of 3 pounds since last visit.  Objective:    BP 116/82   Pulse 64   Ht 5\' 3"  (1.6 m)   Wt 167  lb 12.8 oz (76.1 kg)   BMI 29.72 kg/m   Wt Readings from Last 3 Encounters:  11/16/19 167 lb 12.8 oz (76.1 kg)  10/12/19 164 lb 12.8 oz (74.8 kg)  05/24/19 165 lb (74.8 kg)     Physical Exam- Limited  Constitutional:  Body mass index is 29.72 kg/m. , not in acute distress, normal state of mind Eyes:  EOMI, no exophthalmos Neck: Supple Thyroid: No gross goiter Respiratory: Adequate breathing efforts Musculoskeletal: no gross deformities, strength intact in all four extremities, no gross restriction of joint movements Skin:  no rashes, no hyperemia Neurological: no tremor with outstretched  hands     Recent Results (from the past 2160 hour(s))  TSH     Status: Abnormal   Collection Time: 09/28/19 12:00 AM  Result Value Ref Range   TSH 0.01 (A) 0.41 - 5.90    Comment: free t4 1.68 ( normal 0.45- 1.63)  TSH     Status: Abnormal   Collection Time: 11/11/19  8:27 AM  Result Value Ref Range   TSH 0.026 (L) 0.450 - 4.500 uIU/mL  T4, free     Status: None   Collection Time: 11/11/19  8:27 AM  Result Value Ref Range   Free T4 0.84 0.82 - 1.77 ng/dL  T3, free     Status: None   Collection Time: 11/11/19  8:27 AM  Result Value Ref Range   T3, Free 2.2 2.0 - 4.4 pg/mL     Assessment & Plan:   1. Hyperthyroidism 2. Vitamin D Deficiency -She presents with treatment effect with clinical improvement, status post radioactive iodine ablation on August 05, 2019. Her previsit labs are consistent with treatment effect, however she is not hypothyroid yet.   She will not be initiated on thyroid hormone supplement today.  She will have repeat thyroid function test in 3 weeks and phone visit in 4 weeks.    - She understands the high likelihood of subsequent hypothyroidism and need for lifelong thyroid hormone replacement. -Her pulse rate is 64, she is advised to stay off of beta-blocker.    -She is advised to continue vitamin D2 supplement with vitamin D2 50,000 units weekly x12 weeks.     - I advised patient to maintain close follow up with Jonathon Bellows, DO for primary care needs.      - Time spent on this patient care encounter:  20 minutes of which 50% was spent in  counseling and the rest reviewing  her current and  previous labs / studies and medications  doses and developing a plan for long term care. Talayeh Blas  participated in the discussions, expressed understanding, and voiced agreement with the above plans.  All questions were answered to her satisfaction. she is encouraged to contact clinic should she have any questions or concerns prior to her return  visit.   Follow up plan: Return in about 4 weeks (around 12/14/2019), or Phone, for F/U with Pre-visit Labs.  Marquis Lunch, MD Phone: [phone]  Fax: [phone]   11/16/2019, 9:26 AM

## 2019-12-09 ENCOUNTER — Telehealth (INDEPENDENT_AMBULATORY_CARE_PROVIDER_SITE_OTHER): Payer: PRIVATE HEALTH INSURANCE | Admitting: "Endocrinology

## 2019-12-09 ENCOUNTER — Encounter: Payer: Self-pay | Admitting: "Endocrinology

## 2019-12-09 DIAGNOSIS — E890 Postprocedural hypothyroidism: Secondary | ICD-10-CM

## 2019-12-09 LAB — TSH: TSH: 24.5 u[IU]/mL — ABNORMAL HIGH (ref 0.450–4.500)

## 2019-12-09 LAB — T4, FREE: Free T4: 0.29 ng/dL — ABNORMAL LOW (ref 0.82–1.77)

## 2019-12-09 LAB — T3, FREE: T3, Free: 0.9 pg/mL — ABNORMAL LOW (ref 2.0–4.4)

## 2019-12-09 MED ORDER — LEVOTHYROXINE SODIUM 75 MCG PO TABS: 75.0000 ug | ORAL_TABLET | Freq: Every day | ORAL | 1 refills | Status: DC

## 2019-12-09 NOTE — Progress Notes (Signed)
12/09/2019   Subjective:    Patient ID: Kara Bruce, female    DOB: 08-Mar-1985, PCP Jonathon Bellows, DO   Past Medical History:  Diagnosis Date  . Hyperthyroidism    History reviewed. No pertinent surgical history. Social History   Socioeconomic History  . Marital status: Single    Spouse name: Not on file  . Number of children: Not on file  . Years of education: Not on file  . Highest education level: Not on file  Occupational History  . Not on file  Tobacco Use  . Smoking status: Never Smoker  . Smokeless tobacco: Never Used  Vaping Use  . Vaping Use: Never used  Substance and Sexual Activity  . Alcohol use: No  . Drug use: No  . Sexual activity: Not Currently    Birth control/protection: Abstinence  Other Topics Concern  . Not on file  Social History Narrative  . Not on file   Social Determinants of Health   Financial Resource Strain:   . Difficulty of Paying Living Expenses: Not on file  Food Insecurity:   . Worried About Programme researcher, broadcasting/film/video in the Last Year: Not on file  . Ran Out of Food in the Last Year: Not on file  Transportation Needs:   . Lack of Transportation (Medical): Not on file  . Lack of Transportation (Non-Medical): Not on file  Physical Activity:   . Days of Exercise per Week: Not on file  . Minutes of Exercise per Session: Not on file  Stress:   . Feeling of Stress : Not on file  Social Connections:   . Frequency of Communication with Friends and Family: Not on file  . Frequency of Social Gatherings with Friends and Family: Not on file  . Attends Religious Services: Not on file  . Active Member of Clubs or Organizations: Not on file  . Attends Banker Meetings: Not on file  . Marital Status: Not on file   Outpatient Encounter Medications as of 12/09/2019  Medication Sig  . FERRETTS 325 (106 Fe) MG TABS tablet Take 1 tablet by mouth daily.  Marland Kitchen levothyroxine (SYNTHROID) 75 MCG tablet Take 1 tablet (75 mcg total) by  mouth daily before breakfast.  . Vitamin D, Ergocalciferol, (DRISDOL) 1.25 MG (50000 UNIT) CAPS capsule Take 1 capsule (50,000 Units total) by mouth every 7 (seven) days.   No facility-administered encounter medications on file as of 12/09/2019.   ALLERGIES: No Known Allergies  VACCINATION STATUS:  There is no immunization history on file for this patient.  HPI 34 year old female patient with medical history as above. She is being engaged in follow-up after she was treated with radioactive iodine treatment for Graves' disease. She has dealt with hyperthyroidism from Graves' disease for 3+ years.  She received I-131 thyroid ablation treatment on August 05, 2019.   Her previsit labs are now consistent with treatment effect and at hypothyroid range.  -She reports cold intolerance.  She has progressive weight gain. -Her prior thyroid ultrasound did not reveal any nodular lesions.  She denies any family history of thyroid dysfunction.   Review of Systems  Weight gain of 3 pounds since last visit.  Objective:    There were no vitals taken for this visit.  Wt Readings from Last 3 Encounters:  11/16/19 167 lb 12.8 oz (76.1 kg)  10/12/19 164 lb 12.8 oz (74.8 kg)  05/24/19 165 lb (74.8 kg)     Recent Results (from the past 2160 hour(s))  TSH     Status: Abnormal   Collection Time: 09/28/19 12:00 AM  Result Value Ref Range   TSH 0.01 (A) 0.41 - 5.90    Comment: free t4 1.68 ( normal 0.45- 1.63)  TSH     Status: Abnormal   Collection Time: 11/11/19  8:27 AM  Result Value Ref Range   TSH 0.026 (L) 0.450 - 4.500 uIU/mL  T4, free     Status: None   Collection Time: 11/11/19  8:27 AM  Result Value Ref Range   Free T4 0.84 0.82 - 1.77 ng/dL  T3, free     Status: None   Collection Time: 11/11/19  8:27 AM  Result Value Ref Range   T3, Free 2.2 2.0 - 4.4 pg/mL  TSH     Status: Abnormal   Collection Time: 12/08/19  8:18 AM  Result Value Ref Range   TSH 24.500 (H) 0.450 - 4.500 uIU/mL   T4, free     Status: Abnormal   Collection Time: 12/08/19  8:18 AM  Result Value Ref Range   Free T4 0.29 (L) 0.82 - 1.77 ng/dL  T3, free     Status: Abnormal   Collection Time: 12/08/19  8:18 AM  Result Value Ref Range   T3, Free 0.9 (L) 2.0 - 4.4 pg/mL     Assessment & Plan:   1.  RAI induced hypothyroidism 2.  Vitamin D Deficiency -She is status post radioactive iodine ablation on August 05, 2019. -She presents with treatment effect with hypothyroidism.  She is ready for thyroid hormone supplement.  I discussed and initiated levothyroxine 75 mcg p.o. daily before breakfast.     - We discussed about the correct intake of her thyroid hormone, on empty stomach at fasting, with water, separated by at least 30 minutes from breakfast and other medications,  and separated by more than 4 hours from calcium, iron, multivitamins, acid reflux medications (PPIs). -Patient is made aware of the fact that thyroid hormone replacement is needed for life, dose to be adjusted by periodic monitoring of thyroid function tests. -Her pulse rate is 64, she is advised to stay off of beta-blocker.    -She is advised to continue vitamin D2 supplement with vitamin D2 50,000 units weekly x12 weeks.     - I advised patient to maintain close follow up with Jonathon Bellows, DO for primary care needs.     - Time spent on this patient care encounter:  20 minutes of which 50% was spent in  counseling and the rest reviewing  her current and  previous labs / studies and medications  doses and developing a plan for long term care. Tykia Epling  participated in the discussions, expressed understanding, and voiced agreement with the above plans.  All questions were answered to her satisfaction. she is encouraged to contact clinic should she have any questions or concerns prior to her return visit.    Follow up plan: Return in about 9 weeks (around 02/10/2020) for F/U with Pre-visit Labs.  Marquis Lunch, MD Phone:  [phone]  Fax: [phone]   12/09/2019, 5:11 PM

## 2019-12-14 ENCOUNTER — Telehealth: Payer: PRIVATE HEALTH INSURANCE | Admitting: "Endocrinology

## 2019-12-23 ENCOUNTER — Telehealth: Payer: PRIVATE HEALTH INSURANCE | Admitting: "Endocrinology

## 2020-02-09 LAB — T4, FREE: Free T4: 1.07 ng/dL (ref 0.82–1.77)

## 2020-02-09 LAB — TSH: TSH: 19.3 u[IU]/mL — ABNORMAL HIGH (ref 0.450–4.500)

## 2020-02-15 ENCOUNTER — Other Ambulatory Visit: Payer: Self-pay

## 2020-02-15 ENCOUNTER — Encounter: Payer: Self-pay | Admitting: Nurse Practitioner

## 2020-02-15 ENCOUNTER — Ambulatory Visit (INDEPENDENT_AMBULATORY_CARE_PROVIDER_SITE_OTHER): Payer: PRIVATE HEALTH INSURANCE | Admitting: Nurse Practitioner

## 2020-02-15 VITALS — BP 141/94 | HR 59 | Ht 63.0 in | Wt 166.2 lb

## 2020-02-15 DIAGNOSIS — E890 Postprocedural hypothyroidism: Secondary | ICD-10-CM | POA: Diagnosis not present

## 2020-02-15 MED ORDER — LEVOTHYROXINE SODIUM 88 MCG PO TABS: 88.0000 ug | ORAL_TABLET | Freq: Every day | ORAL | 0 refills | Status: DC

## 2020-02-15 NOTE — Patient Instructions (Signed)

## 2020-02-15 NOTE — Progress Notes (Signed)
02/15/2020   Subjective:    Patient ID: Kara Bruce, female    DOB: [birth year], PCP Jonathon Bellows, DO   Past Medical History:  Diagnosis Date  . Hyperthyroidism    History reviewed. No pertinent surgical history. Social History   Socioeconomic History  . Marital status: Single    Spouse name: Not on file  . Number of children: Not on file  . Years of education: Not on file  . Highest education level: Not on file  Occupational History  . Not on file  Tobacco Use  . Smoking status: Never Smoker  . Smokeless tobacco: Never Used  Vaping Use  . Vaping Use: Never used  Substance and Sexual Activity  . Alcohol use: No  . Drug use: No  . Sexual activity: Not Currently    Birth control/protection: Abstinence  Other Topics Concern  . Not on file  Social History Narrative  . Not on file   Social Determinants of Health   Financial Resource Strain: Not on file  Food Insecurity: Not on file  Transportation Needs: Not on file  Physical Activity: Not on file  Stress: Not on file  Social Connections: Not on file   Outpatient Encounter Medications as of 02/15/2020  Medication Sig  . FERRETTS 325 (106 Fe) MG TABS tablet Take 1 tablet by mouth daily.  . Vitamin D, Ergocalciferol, (DRISDOL) 1.25 MG (50000 UNIT) CAPS capsule Take 1 capsule (50,000 Units total) by mouth every 7 (seven) days.  . [DISCONTINUED] levothyroxine (SYNTHROID) 75 MCG tablet Take 1 tablet (75 mcg total) by mouth daily before breakfast.  . levothyroxine (SYNTHROID) 88 MCG tablet Take 1 tablet (88 mcg total) by mouth daily before breakfast.   No facility-administered encounter medications on file as of 02/15/2020.   ALLERGIES: No Known Allergies  VACCINATION STATUS:  There is no immunization history on file for this patient.  Thyroid Problem Presents for follow-up visit. Symptoms include constipation and fatigue. Patient reports no anxiety, cold intolerance, depressed mood, heat intolerance,  palpitations, tremors, weight gain or weight loss. The symptoms have been stable.   34 year old female patient with medical history as above. She is being engaged in follow-up after she was treated with radioactive iodine treatment for Graves' disease. She has dealt with hyperthyroidism from Graves' disease for 3+ years.  She received I-131 thyroid ablation treatment on August 05, 2019.   Her previsit labs are now consistent with treatment effect and at hypothyroid range.  -She reports cold intolerance.  She has progressive weight gain. -Her prior thyroid ultrasound did not reveal any nodular lesions.  She denies any family history of thyroid dysfunction.  Review of systems  Constitutional: + Minimally fluctuating body weight,  current Body mass index is 29.44 kg/m. , no fatigue, no subjective hyperthermia, no subjective hypothermia Eyes: no blurry vision, no xerophthalmia ENT: no sore throat, no nodules palpated in throat, no dysphagia/odynophagia, no hoarseness Cardiovascular: no chest pain, no shortness of breath, no palpitations, no leg swelling Respiratory: no cough, no shortness of breath Gastrointestinal: no nausea/vomiting/diarrhea Musculoskeletal: no muscle/joint aches Skin: no rashes, no hyperemia Neurological: no tremors, no numbness, no tingling, no dizziness Psychiatric: no depression, no anxiety   Objective:    BP (!) 141/94 (BP Location: Left Arm, Patient Position: Sitting)   Pulse (!) 59   Ht 5\' 3"  (1.6 m)   Wt 166 lb 3.2 oz (75.4 kg)   BMI 29.44 kg/m   Wt Readings from Last 3 Encounters:  02/15/20 166 lb 3.2 oz (  75.4 kg)  11/16/19 167 lb 12.8 oz (76.1 kg)  10/12/19 164 lb 12.8 oz (74.8 kg)     Recent Results (from the past 2160 hour(s))  TSH     Status: Abnormal   Collection Time: 12/08/19  8:18 AM  Result Value Ref Range   TSH 24.500 (H) 0.450 - 4.500 uIU/mL  T4, free     Status: Abnormal   Collection Time: 12/08/19  8:18 AM  Result Value Ref Range   Free  T4 0.29 (L) 0.82 - 1.77 ng/dL  T3, free     Status: Abnormal   Collection Time: 12/08/19  8:18 AM  Result Value Ref Range   T3, Free 0.9 (L) 2.0 - 4.4 pg/mL  TSH     Status: Abnormal   Collection Time: 02/08/20  8:13 AM  Result Value Ref Range   TSH 19.300 (H) 0.450 - 4.500 uIU/mL  T4, free     Status: None   Collection Time: 02/08/20  8:13 AM  Result Value Ref Range   Free T4 1.07 0.82 - 1.77 ng/dL     Assessment & Plan:   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  -Her previsit thyroid function tests are consistent with under replacement.  She is advised to increase her Levothyroxine to 88 mcg po daily before breakfast.   - We discussed about the correct intake of her thyroid hormone, on empty stomach at fasting, with water, separated by at least 30 minutes from breakfast and other medications,  and separated by more than 4 hours from calcium, iron, multivitamins, acid reflux medications (PPIs). -Patient is made aware of the fact that thyroid hormone replacement is needed for life, dose to be adjusted by periodic monitoring of thyroid function tests.  2.  Vitamin D Deficiency -She is advised to continue vitamin D2 supplement daily (she has finished replenishment with Ergocalciferol).  Will recheck vitamin D level on subsequent visits.   - I advised patient to maintain close follow up with Jonathon Bellows, DO for primary care needs.      - Time spent on this patient care encounter:  20 minutes of which 50% was spent in  counseling and the rest reviewing  her current and  previous labs / studies and medications  doses and developing a plan for long term care. Kara Bruce  participated in the discussions, expressed understanding, and voiced agreement with the above plans.  All questions were answered to her satisfaction. she is encouraged to contact clinic should she have any questions or concerns prior to her return  visit.   Follow up plan: Return in about 3 months (around 05/15/2020) for Thyroid follow up, Previsit labs.  Ronny Bacon, [facility] [address] [place], [place] [address] Phone: [phone] Fax: [phone]  02/15/2020, 11:05 AM

## 2020-04-24 ENCOUNTER — Telehealth: Payer: Self-pay

## 2020-04-24 NOTE — Telephone Encounter (Signed)
Patient called and said that she would like her visit to be a phone visit if possible because her time off at work was denied. Please advise.

## 2020-04-24 NOTE — Telephone Encounter (Signed)
That's fine.  It can be virtual

## 2020-04-24 NOTE — Telephone Encounter (Signed)
Notified pt- advised her that the next visit will have to be in office

## 2020-05-09 LAB — T4, FREE: Free T4: 1.13 ng/dL (ref 0.82–1.77)

## 2020-05-09 LAB — TSH: TSH: 14.9 u[IU]/mL — ABNORMAL HIGH (ref 0.450–4.500)

## 2020-05-15 ENCOUNTER — Other Ambulatory Visit: Payer: Self-pay

## 2020-05-15 ENCOUNTER — Telehealth (INDEPENDENT_AMBULATORY_CARE_PROVIDER_SITE_OTHER): Payer: PRIVATE HEALTH INSURANCE | Admitting: Nurse Practitioner

## 2020-05-15 ENCOUNTER — Encounter: Payer: Self-pay | Admitting: Nurse Practitioner

## 2020-05-15 VITALS — Ht 63.0 in | Wt 168.0 lb

## 2020-05-15 DIAGNOSIS — E559 Vitamin D deficiency, unspecified: Secondary | ICD-10-CM

## 2020-05-15 DIAGNOSIS — E890 Postprocedural hypothyroidism: Secondary | ICD-10-CM

## 2020-05-15 MED ORDER — LEVOTHYROXINE SODIUM 100 MCG PO TABS: 100.0000 ug | ORAL_TABLET | Freq: Every day | ORAL | 1 refills | Status: DC

## 2020-05-15 NOTE — Patient Instructions (Signed)

## 2020-05-15 NOTE — Progress Notes (Signed)
05/15/2020  Endocrinology Follow Up Visit   TELEHEALTH VISIT: The patient is being engaged in telehealth visit due to COVID-19.  This type of visit limits physical examination significantly, and thus is not preferable over face-to-face encounters.  I connected with  Kara Bruce on 05/15/20 by a video enabled telemedicine application and verified that I am speaking with the correct person using two identifiers.   I discussed the limitations of evaluation and management by telemedicine. The patient expressed understanding and agreed to proceed.    The participants involved in this visit include: Dani Gobble, NP located at [facility] and Kara Bruce  located at their personal residence listed.  Subjective:    Patient ID: Kara Bruce, female    DOB: [birth year], PCP Jonathon Bellows, DO   Past Medical History:  Diagnosis Date  . Hyperthyroidism    History reviewed. No pertinent surgical history. Social History   Socioeconomic History  . Marital status: Single    Spouse name: Not on file  . Number of children: Not on file  . Years of education: Not on file  . Highest education level: Not on file  Occupational History  . Not on file  Tobacco Use  . Smoking status: Never Smoker  . Smokeless tobacco: Never Used  Vaping Use  . Vaping Use: Never used  Substance and Sexual Activity  . Alcohol use: No  . Drug use: No  . Sexual activity: Not Currently    Birth control/protection: Abstinence  Other Topics Concern  . Not on file  Social History Narrative  . Not on file   Social Determinants of Health   Financial Resource Strain: Not on file  Food Insecurity: Not on file  Transportation Needs: Not on file  Physical Activity: Not on file  Stress: Not on file  Social Connections: Not on file   Outpatient Encounter Medications as of 05/15/2020  Medication Sig  . Cholecalciferol (VITAMIN D3 PO) Take 1 tablet by mouth daily in the  afternoon.  Marland Kitchen FERRETTS 325 (106 Fe) MG TABS tablet Take 1 tablet by mouth daily.  Marland Kitchen levothyroxine (SYNTHROID) 100 MCG tablet Take 1 tablet (100 mcg total) by mouth daily before breakfast.  . Vitamin D, Ergocalciferol, (DRISDOL) 1.25 MG (50000 UNIT) CAPS capsule Take 1 capsule (50,000 Units total) by mouth every 7 (seven) days. (Patient not taking: Reported on 05/15/2020)  . [DISCONTINUED] levothyroxine (SYNTHROID) 88 MCG tablet Take 1 tablet (88 mcg total) by mouth daily before breakfast.   No facility-administered encounter medications on file as of 05/15/2020.   ALLERGIES: No Known Allergies  VACCINATION STATUS:  There is no immunization history on file for this patient.  Thyroid Problem Presents for follow-up visit. Symptoms include constipation and fatigue. Patient reports no anxiety, cold intolerance, depressed mood, heat intolerance, palpitations, tremors, weight gain or weight loss. The symptoms have been improving.   35 year old female patient with medical history as above. She is being engaged in follow-up after she was treated with radioactive iodine treatment for Graves' disease. She has dealt with hyperthyroidism from Graves' disease for 3+ years.  She received I-131 thyroid ablation treatment on August 05, 2019.    Her previsit labs are now consistent with treatment effect and at hypothyroid range.   -She reports cold intolerance, fatigue, and constipation (all improving). -Her prior thyroid ultrasound did not reveal any nodular lesions.  She denies any family history of thyroid dysfunction.  Review of systems  Constitutional: + Minimally fluctuating body weight,  current Body  mass index is 29.76 kg/m. , + fatigue, no subjective hyperthermia, no subjective hypothermia Eyes: no blurry vision, no xerophthalmia ENT: no sore throat, no nodules palpated in throat, no dysphagia/odynophagia, no hoarseness Cardiovascular: no chest pain, no shortness of breath, no palpitations, no leg  swelling Respiratory: no cough, no shortness of breath Gastrointestinal: no nausea/vomiting/diarrhea, intermittent constipation Musculoskeletal: no muscle/joint aches Skin: no rashes, no hyperemia Neurological: no tremors, no numbness, no tingling, no dizziness Psychiatric: no depression, no anxiety   Objective:    Ht 5\' 3"  (1.6 m)   Wt 168 lb (76.2 kg)   BMI 29.76 kg/m   Wt Readings from Last 3 Encounters:  05/15/20 168 lb (76.2 kg)  02/15/20 166 lb 3.2 oz (75.4 kg)  11/16/19 167 lb 12.8 oz (76.1 kg)    BP Readings from Last 3 Encounters:  02/15/20 (!) 141/94  11/16/19 116/82  10/12/19 108/76    Physical Exam- Telehealth- significantly limited due to nature of visit  Constitutional: Body mass index is 29.76 kg/m. , not in acute distress, normal state of mind Respiratory: Adequate breathing efforts   Recent Results (from the past 2160 hour(s))  TSH     Status: Abnormal   Collection Time: 05/08/20  3:49 PM  Result Value Ref Range   TSH 14.900 (H) 0.450 - 4.500 uIU/mL  T4, free     Status: None   Collection Time: 05/08/20  3:49 PM  Result Value Ref Range   Free T4 1.13 0.82 - 1.77 ng/dL     Assessment & Plan:   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  -Her previsit TFTs are consistent with under-replacement.  She is advised to increase her Levothyroxine to 100 mcg po daily before breakfast.    - We discussed about the correct intake of her thyroid hormone, on empty stomach at fasting, with water, separated by at least 30 minutes from breakfast and other medications,  and separated by more than 4 hours from calcium, iron, multivitamins, acid reflux medications (PPIs). -Patient is made aware of the fact that thyroid hormone replacement is needed for life, dose to be adjusted by periodic monitoring of thyroid function tests.  2.  Vitamin D Deficiency -She is advised to continue vitamin D2 supplement daily (she  has finished replenishment with Ergocalciferol).  Will recheck vitamin D prior to next visit.   - I advised patient to maintain close follow up with August 07, 2019, DO for primary care needs.      I spent 20 minutes dedicated to the care of this patient on the date of this encounter to include pre-visit review of records, face-to-face time with the patient, and post visit ordering of  testing. Chavon Mao  participated in the discussions, expressed understanding, and voiced agreement with the above plans.  All questions were answered to her satisfaction. she is encouraged to contact clinic should she have any questions or concerns prior to her return visit.   Follow up plan: Return in about 4 months (around 09/14/2020) for Thyroid follow up, Previsit labs.  09/16/2020, [facility] [address] [place] [address] [place], [place] Phone: [phone] Fax: [phone]  05/15/2020, 8:11 AM

## 2020-09-08 LAB — TSH: TSH: 12.6 u[IU]/mL — ABNORMAL HIGH (ref 0.450–4.500)

## 2020-09-08 LAB — VITAMIN D 25 HYDROXY (VIT D DEFICIENCY, FRACTURES): Vit D, 25-Hydroxy: 17.4 ng/mL — ABNORMAL LOW (ref 30.0–100.0)

## 2020-09-08 LAB — T4, FREE: Free T4: 1.25 ng/dL (ref 0.82–1.77)

## 2020-09-13 NOTE — Patient Instructions (Signed)

## 2020-09-14 ENCOUNTER — Encounter: Payer: Self-pay | Admitting: Nurse Practitioner

## 2020-09-14 ENCOUNTER — Ambulatory Visit (INDEPENDENT_AMBULATORY_CARE_PROVIDER_SITE_OTHER): Payer: PRIVATE HEALTH INSURANCE | Admitting: Nurse Practitioner

## 2020-09-14 ENCOUNTER — Other Ambulatory Visit: Payer: Self-pay

## 2020-09-14 VITALS — BP 137/89 | HR 65 | Ht 63.0 in | Wt 167.8 lb

## 2020-09-14 DIAGNOSIS — E890 Postprocedural hypothyroidism: Secondary | ICD-10-CM | POA: Diagnosis not present

## 2020-09-14 DIAGNOSIS — E559 Vitamin D deficiency, unspecified: Secondary | ICD-10-CM | POA: Diagnosis not present

## 2020-09-14 MED ORDER — LEVOTHYROXINE SODIUM 112 MCG PO TABS: 112.0000 ug | ORAL_TABLET | Freq: Every day | ORAL | 1 refills | Status: DC

## 2020-09-14 MED ORDER — VITAMIN D (ERGOCALCIFEROL) 1.25 MG (50000 UNIT) PO CAPS: 50000.0000 [IU] | ORAL_CAPSULE | ORAL | 0 refills | Status: DC

## 2020-09-14 NOTE — Progress Notes (Signed)
09/14/2020  Endocrinology Follow Up Visit     Subjective:    Patient ID: Kara Bruce, female    DOB: November 20, 1985, PCP Jonathon Bellows, DO   Past Medical History:  Diagnosis Date   Hyperthyroidism    History reviewed. No pertinent surgical history. Social History   Socioeconomic History   Marital status: Single    Spouse name: Not on file   Number of children: Not on file   Years of education: Not on file   Highest education level: Not on file  Occupational History   Not on file  Tobacco Use   Smoking status: Never   Smokeless tobacco: Never  Vaping Use   Vaping Use: Never used  Substance and Sexual Activity   Alcohol use: No   Drug use: No   Sexual activity: Not Currently    Birth control/protection: Abstinence  Other Topics Concern   Not on file  Social History Narrative   Not on file   Social Determinants of Health   Financial Resource Strain: Not on file  Food Insecurity: Not on file  Transportation Needs: Not on file  Physical Activity: Not on file  Stress: Not on file  Social Connections: Not on file   Outpatient Encounter Medications as of 09/14/2020  Medication Sig   Cholecalciferol (VITAMIN D3 PO) Take 1 tablet by mouth daily in the afternoon.   FERRETTS 325 (106 Fe) MG TABS tablet Take 1 tablet by mouth daily.   levothyroxine (SYNTHROID) 112 MCG tablet Take 1 tablet (112 mcg total) by mouth daily before breakfast.   Vitamin D, Ergocalciferol, (DRISDOL) 1.25 MG (50000 UNIT) CAPS capsule Take 1 capsule (50,000 Units total) by mouth every 7 (seven) days.   [DISCONTINUED] levothyroxine (SYNTHROID) 100 MCG tablet Take 1 tablet (100 mcg total) by mouth daily before breakfast.   [DISCONTINUED] Vitamin D, Ergocalciferol, (DRISDOL) 1.25 MG (50000 UNIT) CAPS capsule Take 1 capsule (50,000 Units total) by mouth every 7 (seven) days. (Patient not taking: Reported on 05/15/2020)   No facility-administered encounter medications on file as of 09/14/2020.    ALLERGIES: No Known Allergies  VACCINATION STATUS:  There is no immunization history on file for this patient.  Thyroid Problem Presents for follow-up visit. Symptoms include menstrual problem (irregular menses with prolonged bleeding). Patient reports no anxiety, cold intolerance, constipation, depressed mood, fatigue, heat intolerance, palpitations, tremors, weight gain or weight loss. The symptoms have been improving.    35 year old female patient with medical history as above. She is being engaged in follow-up after she was treated with radioactive iodine treatment for Graves' disease. She has dealt with hyperthyroidism from Graves' disease for 3+ years.  She received I-131 thyroid ablation treatment on August 05, 2019.    -Her prior thyroid ultrasound did not reveal any nodular lesions.  She denies any family history of thyroid dysfunction.  Review of systems  Constitutional: + Minimally fluctuating body weight,  current Body mass index is 29.72 kg/m. , no fatigue, no subjective hyperthermia, no subjective hypothermia Eyes: no blurry vision, no xerophthalmia ENT: no sore throat, no nodules palpated in throat, no dysphagia/odynophagia, no hoarseness Cardiovascular: no chest pain, no shortness of breath, no palpitations, no leg swelling Respiratory: no cough, no shortness of breath Gastrointestinal: no nausea/vomiting/diarrhea Genitourinary: + menstrual irregularities (prolonged menses) Musculoskeletal: no muscle/joint aches Skin: no rashes, no hyperemia Neurological: no tremors, no numbness, no tingling, no dizziness Psychiatric: no depression, no anxiety   Objective:    BP 137/89   Pulse 65   Ht  5\' 3"  (1.6 m)   Wt 167 lb 12.8 oz (76.1 kg)   BMI 29.72 kg/m   Wt Readings from Last 3 Encounters:  09/14/20 167 lb 12.8 oz (76.1 kg)  05/15/20 168 lb (76.2 kg)  02/15/20 166 lb 3.2 oz (75.4 kg)    BP Readings from Last 3 Encounters:  09/14/20 137/89  02/15/20 (!) 141/94   11/16/19 116/82     Physical Exam- Limited  Constitutional:  Body mass index is 29.72 kg/m. , not in acute distress, normal state of mind Eyes:  EOMI, no exophthalmos Neck: Supple Cardiovascular: RRR, no murmurs, rubs, or gallops, no edema Respiratory: Adequate breathing efforts, no crackles, rales, rhonchi, or wheezing Musculoskeletal: no gross deformities, strength intact in all four extremities, no gross restriction of joint movements Skin:  no rashes, no hyperemia Neurological: no tremor with outstretched hands   Recent Results (from the past 2160 hour(s))  TSH     Status: Abnormal   Collection Time: 09/07/20 12:14 PM  Result Value Ref Range   TSH 12.600 (H) 0.450 - 4.500 uIU/mL  T4, free     Status: None   Collection Time: 09/07/20 12:14 PM  Result Value Ref Range   Free T4 1.25 0.82 - 1.77 ng/dL  VITAMIN D 25 Hydroxy (Vit-D Deficiency, Fractures)     Status: Abnormal   Collection Time: 09/07/20 12:14 PM  Result Value Ref Range   Vit D, 25-Hydroxy 17.4 (L) 30.0 - 100.0 ng/mL    Comment: Vitamin D deficiency has been defined by the Institute of Medicine and an Endocrine Society practice guideline as a level of serum 25-OH vitamin D less than 20 ng/mL (1,2). The Endocrine Society went on to further define vitamin D insufficiency as a level between 21 and 29 ng/mL (2). 1. IOM (Institute of Medicine). 2010. Dietary reference    intakes for calcium and D. [place]: The    2011. 2. Holick MF, Binkley Baraga, Bischoff-Ferrari HA, et al.    Evaluation, treatment, and prevention of vitamin D    deficiency: an Endocrine Society clinical practice    guideline. JCEM. 2011 Jul; 96(7):1911-30.      Assessment & Plan:   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  -Her previsit thyroid function tests are consistent with slight under-replacement.  She is advised to increase her dose of Levothyroxine to  112 mcg po daily before breakfast.     - We discussed about the correct intake of her thyroid hormone, on empty stomach at fasting, with water, separated by at least 30 minutes from breakfast and other medications,  and separated by more than 4 hours from calcium, iron, multivitamins, acid reflux medications (PPIs). -Patient is made aware of the fact that thyroid hormone replacement is needed for life, dose to be adjusted by periodic monitoring of thyroid function tests.  2.  Vitamin D Deficiency -Her most recent vitamin D level was 17.4 on 09/07/20.  She has been on Ergocalciferol in the past and is now taking OTC Vitamin D 3 5000 units daily.  She is advised to restart her Ergocalciferol 50000 units weekly x 12 weeks.   - I advised patient to maintain close follow up with 09/09/20, DO for primary care needs.     I spent 20 minutes in the care of the patient today including review of labs from Thyroid Function, CMP, and other relevant labs ; imaging/biopsy records (current and previous including  abstractions from other facilities); face-to-face time discussing  her lab results and symptoms, medications doses, her options of short and long term treatment based on the latest standards of care / guidelines;   and documenting the encounter.  Kara Bruce  participated in the discussions, expressed understanding, and voiced agreement with the above plans.  All questions were answered to her satisfaction. she is encouraged to contact clinic should she have any questions or concerns prior to her return visit.   Follow up plan: Return in about 4 months (around 01/15/2021) for Thyroid follow up, Previsit labs.  Ronny Bacon, [facility] [address] [place], [place] [address] Phone: [phone] Fax: [phone]  09/14/2020, 3:36 PM

## 2020-09-17 ENCOUNTER — Encounter (HOSPITAL_COMMUNITY): Payer: Self-pay | Admitting: Emergency Medicine

## 2020-09-17 ENCOUNTER — Emergency Department (HOSPITAL_COMMUNITY)
Admission: EM | Admit: 2020-09-17 | Discharge: 2020-09-17 | Disposition: A | Discharge status: Home / Self Care | Payer: PRIVATE HEALTH INSURANCE | Attending: Emergency Medicine | Admitting: Emergency Medicine

## 2020-09-17 ENCOUNTER — Other Ambulatory Visit: Payer: Self-pay

## 2020-09-17 DIAGNOSIS — L0291 Cutaneous abscess, unspecified: Secondary | ICD-10-CM

## 2020-09-17 DIAGNOSIS — N764 Abscess of vulva: Secondary | ICD-10-CM | POA: Insufficient documentation

## 2020-09-17 MED ORDER — FENTANYL CITRATE (PF) 100 MCG/2ML IJ SOLN
50.0000 ug | Freq: Once | INTRAMUSCULAR | Status: AC
  Administered 2020-09-17: 50 ug via INTRAVENOUS
  Filled 2020-09-17: qty 2

## 2020-09-17 MED ORDER — LIDOCAINE HCL (PF) 1 % IJ SOLN
5.0000 mL | Freq: Once | INTRAMUSCULAR | Status: AC
  Administered 2020-09-17: 5 mL

## 2020-09-17 MED ORDER — HYDROCODONE-ACETAMINOPHEN 5-325 MG PO TABS: 1.0000 | ORAL_TABLET | Freq: Four times a day (QID) | ORAL | 0 refills | Status: DC | PRN

## 2020-09-17 MED ORDER — LIDOCAINE-EPINEPHRINE-TETRACAINE (LET) TOPICAL GEL
3.0000 mL | Freq: Once | TOPICAL | Status: AC
  Administered 2020-09-17: 3 mL via TOPICAL
  Filled 2020-09-17: qty 3

## 2020-09-17 NOTE — Discharge Instructions (Addendum)
Continue the antibiotics you received from urgent care  Prescription given for Norco. Take medication as directed and do not operate machinery, drive a car, or work while taking this medication as it can make you drowsy.   Please follow up with your OB-GYN within 5-7 days for re-evaluation of your symptoms. Please return to the emergency department for any new or worsening symptoms.

## 2020-09-17 NOTE — ED Triage Notes (Signed)
Pt c/o vaginal abscess, pt seen earlier today at [facility] and advised to come to ED for eval and possible I&D.

## 2020-09-17 NOTE — ED Provider Notes (Signed)
[facility] EMERGENCY DEPARTMENT Provider Note   CSN: [id number] Arrival date & time: 09/17/20  1644     History Chief Complaint  Patient presents with   Abscess    Kara Bruce is a 35 y.o. female.  HPI  Pt is a 35 y/o female with a h/o hyperthyroidism who presents to the ED today for eval of swelling and pain to the right labial area. States she went to urgent care and was told she had a cyst and needed to have it drained in the ED. They did start her on bactrim. She denies any drainage currently. Denies fevers or systemic sxs. Hx same. She was seen at   Past Medical History:  Diagnosis Date   Hyperthyroidism     Patient Active Problem List   Diagnosis Date Noted   Graves disease 07/05/2019   Vitamin D deficiency 05/24/2019   Hyperthyroidism 09/23/2016    History reviewed. No pertinent surgical history.   OB History   No obstetric history on file.     Family History  Problem Relation Age of Onset   Hypertension Mother    Cancer Mother    Hypertension Father    Hypertension Sister    Diabetes Maternal Aunt    Diabetes Paternal Uncle    Stroke Paternal Grandmother     Social History   Tobacco Use   Smoking status: Never   Smokeless tobacco: Never  Vaping Use   Vaping Use: Never used  Substance Use Topics   Alcohol use: No   Drug use: No    Home Medications Prior to Admission medications   Medication Sig Start Date End Date Taking? Authorizing Provider  HYDROcodone-acetaminophen (NORCO/VICODIN) 5-325 MG tablet Take 1 tablet by mouth every 6 (six) hours as needed. 09/17/20  Yes Jillene Wehrenberg S, PA-C  Cholecalciferol (VITAMIN D3 PO) Take 1 tablet by mouth daily in the afternoon.    [provider]  FERRETTS 325 (106 Fe) MG TABS tablet Take 1 tablet by mouth daily. 04/01/19   [provider]  levothyroxine (SYNTHROID) 112 MCG tablet Take 1 tablet (112 mcg total) by mouth daily before breakfast. 09/14/20   Dani Gobble, NP   Vitamin D, Ergocalciferol, (DRISDOL) 1.25 MG (50000 UNIT) CAPS capsule Take 1 capsule (50,000 Units total) by mouth every 7 (seven) days. 09/14/20   Dani Gobble, NP    Allergies    Patient has no known allergies.  Review of Systems   Review of Systems  Constitutional:  Negative for fever.  Respiratory:  Negative for shortness of breath.   Genitourinary:        Vaginal pain  Skin:        abscess   Physical Exam Updated Vital Signs BP (!) 147/85 (BP Location: Right Arm)   Pulse 80   Temp 97.8 F (36.6 C) (Oral)   Resp 16   Ht 5\' 3"  (1.6 m)   Wt 75.8 kg   SpO2 98%   BMI 29.58 kg/m   Physical Exam Vitals and nursing note reviewed.  Constitutional:      General: She is not in acute distress.    Appearance: She is well-developed.  HENT:     Head: Normocephalic and atraumatic.  Eyes:     Conjunctiva/sclera: Conjunctivae normal.  Cardiovascular:     Rate and Rhythm: Normal rate.  Pulmonary:     Effort: Pulmonary effort is normal.  Genitourinary:    Comments: Chaperone present. 2-3 cm fluctuant area to the right labial  area that is ttp. No crepitus noted. Musculoskeletal:        General: Normal range of motion.     Cervical back: Neck supple.  Skin:    General: Skin is warm and dry.  Neurological:     Mental Status: She is alert.    ED Results / Procedures / Treatments   Labs (all labs ordered are listed, but only abnormal results are displayed) Labs Reviewed - No data to display  EKG None  Radiology No results found.  Procedures .Marland KitchenIncision and Drainage  Date/Time: 09/17/2020 7:15 PM Performed by: Karrie Meres, PA-C Authorized by: Karrie Meres, PA-C   Consent:    Consent obtained:  Verbal   Consent given by:  Patient   Risks, benefits, and alternatives were discussed: yes     Risks discussed:  Bleeding, incomplete drainage and pain   Alternatives discussed:  No treatment Universal protocol:    Procedure explained and questions  answered to patient or proxy's satisfaction: yes     Immediately prior to procedure, a time out was called: yes     Patient identity confirmed:  Verbally with patient Location:    Type:  Bartholin cyst   Size:  3   Location:  Anogenital Pre-procedure details:    Skin preparation:  Povidone-iodine Anesthesia:    Anesthesia method:  Topical application and local infiltration   Topical anesthetic:  LET   Local anesthetic:  Lidocaine 1% w/o epi Procedure type:    Complexity:  Simple Procedure details:    Ultrasound guidance: no     Needle aspiration: no     Incision types:  Stab incision   Incision depth:  Dermal   Drainage:  Bloody   Drainage amount:  Copious   Wound treatment:  Wound left open   Packing materials:  None Post-procedure details:    Procedure completion:  Tolerated   Medications Ordered in ED Medications  lidocaine-EPINEPHrine-tetracaine (LET) topical gel (3 mLs Topical Given 09/17/20 1803)  lidocaine (PF) (XYLOCAINE) 1 % injection 5 mL (5 mLs Other Given 09/17/20 1802)  fentaNYL (SUBLIMAZE) injection 50 mcg (50 mcg Intravenous Given 09/17/20 1756)    ED Course  I have reviewed the triage vital signs and the nursing notes.  Pertinent labs & imaging results that were available during my care of the patient were reviewed by me and considered in my medical decision making (see chart for details).    MDM Rules/Calculators/A&P                          35 year old female presenting to the emergency department today for evaluation of an abscess to the right labial area.  Symptoms have been present for several days.  Seen at urgent care yesterday and started on Bactrim but symptoms have persisted.  The area was not drained yesterday.  She has no systemic symptoms.  The area was incised and drained in the ED and patient had some improvement of symptoms.  I advised her to continue the Bactrim at home and also gave Rx for pain medications.  Advised warm sits baths at home.   She plans to follow-up with OB/GYN.  I encouraged her to return to the ED for any new or worsening symptoms.  All questions were answered.  Patient stable for discharge.   Final Clinical Impression(s) / ED Diagnoses Final diagnoses:  Abscess    Rx / DC Orders ED Discharge Orders  Ordered    HYDROcodone-acetaminophen (NORCO/VICODIN) 5-325 MG tablet  Every 6 hours PRN        09/17/20 1914             Rayne Du 09/17/20 1915    Vanetta Mulders, MD 09/21/20 1252

## 2021-01-15 ENCOUNTER — Ambulatory Visit: Payer: PRIVATE HEALTH INSURANCE | Admitting: Nurse Practitioner

## 2021-01-15 NOTE — Patient Instructions (Incomplete)

## 2021-02-23 LAB — T4, FREE: Free T4: 1.71 ng/dL (ref 0.82–1.77)

## 2021-02-23 LAB — TSH: TSH: 0.041 u[IU]/mL — ABNORMAL LOW (ref 0.450–4.500)

## 2021-03-05 ENCOUNTER — Other Ambulatory Visit: Payer: Self-pay

## 2021-03-05 ENCOUNTER — Encounter: Payer: Self-pay | Admitting: Nurse Practitioner

## 2021-03-05 ENCOUNTER — Ambulatory Visit (INDEPENDENT_AMBULATORY_CARE_PROVIDER_SITE_OTHER): Payer: PRIVATE HEALTH INSURANCE | Admitting: Nurse Practitioner

## 2021-03-05 VITALS — BP 118/81 | HR 65 | Ht 63.0 in | Wt 165.8 lb

## 2021-03-05 DIAGNOSIS — E559 Vitamin D deficiency, unspecified: Secondary | ICD-10-CM

## 2021-03-05 DIAGNOSIS — E890 Postprocedural hypothyroidism: Secondary | ICD-10-CM | POA: Diagnosis not present

## 2021-03-05 MED ORDER — LEVOTHYROXINE SODIUM 112 MCG PO TABS: 112.0000 ug | ORAL_TABLET | Freq: Every day | ORAL | 1 refills | Status: DC

## 2021-03-05 NOTE — Progress Notes (Signed)
03/05/2021  Endocrinology Follow Up Visit     Subjective:    Patient ID: Kara Bruce, female    DOB: November 23, 1985, PCP Clemmie Krill, FNP   Past Medical History:  Diagnosis Date   Hyperthyroidism    History reviewed. No pertinent surgical history. Social History   Socioeconomic History   Marital status: Single    Spouse name: Not on file   Number of children: Not on file   Years of education: Not on file   Highest education level: Not on file  Occupational History   Not on file  Tobacco Use   Smoking status: Never   Smokeless tobacco: Never  Vaping Use   Vaping Use: Never used  Substance and Sexual Activity   Alcohol use: No   Drug use: No   Sexual activity: Not Currently    Birth control/protection: Abstinence  Other Topics Concern   Not on file  Social History Narrative   Not on file   Social Determinants of Health   Financial Resource Strain: Not on file  Food Insecurity: Not on file  Transportation Needs: Not on file  Physical Activity: Not on file  Stress: Not on file  Social Connections: Not on file   Outpatient Encounter Medications as of 03/05/2021  Medication Sig   Cholecalciferol (VITAMIN D3 PO) Take 1 tablet by mouth daily in the afternoon.   FERRETTS 325 (106 Fe) MG TABS tablet Take 1 tablet by mouth daily.   [DISCONTINUED] levothyroxine (SYNTHROID) 112 MCG tablet Take 1 tablet (112 mcg total) by mouth daily before breakfast.   levothyroxine (SYNTHROID) 112 MCG tablet Take 1 tablet (112 mcg total) by mouth daily before breakfast.   [DISCONTINUED] HYDROcodone-acetaminophen (NORCO/VICODIN) 5-325 MG tablet Take 1 tablet by mouth every 6 (six) hours as needed. (Patient not taking: Reported on 03/05/2021)   [DISCONTINUED] Vitamin D, Ergocalciferol, (DRISDOL) 1.25 MG (50000 UNIT) CAPS capsule Take 1 capsule (50,000 Units total) by mouth every 7 (seven) days. (Patient not taking: Reported on 03/05/2021)   No facility-administered encounter  medications on file as of 03/05/2021.   ALLERGIES: No Known Allergies  VACCINATION STATUS:  There is no immunization history on file for this patient.  Thyroid Problem Presents for follow-up visit. Symptoms include menstrual problem (irregular menses with prolonged bleeding). Patient reports no anxiety, cold intolerance, constipation, depressed mood, fatigue, heat intolerance, palpitations, tremors, weight gain or weight loss. The symptoms have been improving.    36 year old female patient with medical history as above. She is being engaged in follow-up after she was treated with radioactive iodine treatment for Graves' disease. She has dealt with hyperthyroidism from Graves' disease for 3+ years.  She received I-131 thyroid ablation treatment on August 05, 2019.    -Her prior thyroid ultrasound did not reveal any nodular lesions.  She denies any family history of thyroid dysfunction.  Review of systems  Constitutional: + Minimally fluctuating body weight,  current Body mass index is 29.37 kg/m. , no fatigue, no subjective hyperthermia, no subjective hypothermia Eyes: no blurry vision, no xerophthalmia ENT: no sore throat, no nodules palpated in throat, no dysphagia/odynophagia, no hoarseness Cardiovascular: no chest pain, no shortness of breath, no palpitations, no leg swelling Respiratory: no cough, no shortness of breath Gastrointestinal: no nausea/vomiting/diarrhea Genitourinary: + menstrual irregularities-says it is improving with time Musculoskeletal: no muscle/joint aches Skin: no rashes, no hyperemia Neurological: no tremors, no numbness, no tingling, no dizziness Psychiatric: no depression, no anxiety   Objective:    BP 118/81  Pulse 65    Ht 5\' 3"  (1.6 m)    Wt 165 lb 12.8 oz (75.2 kg)    SpO2 100%    BMI 29.37 kg/m   Wt Readings from Last 3 Encounters:  03/05/21 165 lb 12.8 oz (75.2 kg)  09/17/20 167 lb (75.8 kg)  09/14/20 167 lb 12.8 oz (76.1 kg)    BP Readings  from Last 3 Encounters:  03/05/21 118/81  09/17/20 115/71  09/14/20 137/89     Physical Exam- Limited  Constitutional:  Body mass index is 29.37 kg/m. , not in acute distress, normal state of mind Eyes:  EOMI, no exophthalmos Neck: Supple Cardiovascular: RRR, no murmurs, rubs, or gallops, no edema Respiratory: Adequate breathing efforts, no crackles, rales, rhonchi, or wheezing Musculoskeletal: no gross deformities, strength intact in all four extremities, no gross restriction of joint movements Skin:  no rashes, no hyperemia Neurological: no tremor with outstretched hands   Recent Results (from the past 2160 hour(s))  TSH     Status: Abnormal   Collection Time: 02/22/21  3:11 PM  Result Value Ref Range   TSH 0.041 (L) 0.450 - 4.500 uIU/mL  T4, free     Status: None   Collection Time: 02/22/21  3:11 PM  Result Value Ref Range   Free T4 1.71 0.82 - 1.77 ng/dL    Latest Reference Range & Units 12/08/19 08:18 02/08/20 08:13 05/08/20 15:49 09/07/20 12:14 02/22/21 15:11  TSH 0.450 - 4.500 uIU/mL 24.500 (H) 19.300 (H) 14.900 (H) 12.600 (H) 0.041 (L)  Triiodothyronine,Free,Serum 2.0 - 4.4 pg/mL 0.9 (L)      T4,Free(Direct) 0.82 - 1.77 ng/dL 0.29 (L) 1.07 1.13 1.25 1.71  (H): Data is abnormally high (L): Data is abnormally low  Assessment & Plan:   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  -Her previsit thyroid function tests are consistent with appropriate hormone replacement.  She is advised to continue Levothyroxine 112 mcg po daily before breakfast.     - We discussed about the correct intake of her thyroid hormone, on empty stomach at fasting, with water, separated by at least 30 minutes from breakfast and other medications,  and separated by more than 4 hours from calcium, iron, multivitamins, acid reflux medications (PPIs). -Patient is made aware of the fact that thyroid hormone replacement is needed for life, dose to  be adjusted by periodic monitoring of thyroid function tests.  2.  Vitamin D Deficiency -Her most recent vitamin D level was 17.4 on 09/07/20.  She has been on Ergocalciferol in the past and is now taking OTC Vitamin D 3 5000 units daily.  Will recheck vitamin D level prior to next visit.   - I advised patient to maintain close follow up with Clemmie Krill, FNP for primary care needs.     I spent 31 minutes in the care of the patient today including review of labs from Thyroid Function, CMP, and other relevant labs ; imaging/biopsy records (current and previous including abstractions from other facilities); face-to-face time discussing  her lab results and symptoms, medications doses, her options of short and long term treatment based on the latest standards of care / guidelines;   and documenting the encounter.  Vennie Remache  participated in the discussions, expressed understanding, and voiced agreement with the above plans.  All questions were answered to her satisfaction. she is encouraged to contact clinic should she have any questions or concerns prior to her  return visit.   Follow up plan: Return in about 4 months (around 07/03/2021) for Thyroid follow up, Previsit labs.  Rayetta Pigg, [facility] [address] [place], [place] [address] Phone: [phone] Fax: [phone]  03/05/2021, 4:02 PM

## 2021-03-05 NOTE — Patient Instructions (Signed)

## 2021-06-28 LAB — TSH: TSH: 0.028 u[IU]/mL — ABNORMAL LOW (ref 0.450–4.500)

## 2021-06-28 LAB — VITAMIN D 25 HYDROXY (VIT D DEFICIENCY, FRACTURES): Vit D, 25-Hydroxy: 17.3 ng/mL — ABNORMAL LOW (ref 30.0–100.0)

## 2021-06-28 LAB — T4, FREE: Free T4: 1.99 ng/dL — ABNORMAL HIGH (ref 0.82–1.77)

## 2021-07-03 ENCOUNTER — Encounter: Payer: Self-pay | Admitting: Nurse Practitioner

## 2021-07-03 ENCOUNTER — Ambulatory Visit (INDEPENDENT_AMBULATORY_CARE_PROVIDER_SITE_OTHER): Payer: PRIVATE HEALTH INSURANCE | Admitting: Nurse Practitioner

## 2021-07-03 VITALS — BP 119/77 | HR 77 | Ht 63.0 in | Wt 164.0 lb

## 2021-07-03 DIAGNOSIS — E890 Postprocedural hypothyroidism: Secondary | ICD-10-CM

## 2021-07-03 DIAGNOSIS — E559 Vitamin D deficiency, unspecified: Secondary | ICD-10-CM | POA: Diagnosis not present

## 2021-07-03 MED ORDER — VITAMIN D (ERGOCALCIFEROL) 1.25 MG (50000 UNIT) PO CAPS: 50000.0000 [IU] | ORAL_CAPSULE | ORAL | 0 refills | Status: DC

## 2021-07-03 MED ORDER — LEVOTHYROXINE SODIUM 100 MCG PO TABS: 100.0000 ug | ORAL_TABLET | Freq: Every day | ORAL | 1 refills | Status: DC

## 2021-07-03 NOTE — Progress Notes (Signed)
?07/03/2021 ? ?Endocrinology Follow Up Visit ? ? ? ? ?Subjective:  ? ? Patient ID: Kara Bruce, female    DOB: 1985-09-02, PCP Jaclynn Guarneri, FNP ? ? ?Past Medical History:  ?Diagnosis Date  ? Hyperthyroidism   ? ?History reviewed. No pertinent surgical history. ?Social History  ? ?Socioeconomic History  ? Marital status: Single  ?  Spouse name: Not on file  ? Number of children: Not on file  ? Years of education: Not on file  ? Highest education level: Not on file  ?Occupational History  ? Not on file  ?Tobacco Use  ? Smoking status: Never  ? Smokeless tobacco: Never  ?Vaping Use  ? Vaping Use: Never used  ?Substance and Sexual Activity  ? Alcohol use: No  ? Drug use: No  ? Sexual activity: Not Currently  ?  Birth control/protection: Abstinence  ?Other Topics Concern  ? Not on file  ?Social History Narrative  ? Not on file  ? ?Social Determinants of Health  ? ?Financial Resource Strain: Not on file  ?Food Insecurity: Not on file  ?Transportation Needs: Not on file  ?Physical Activity: Not on file  ?Stress: Not on file  ?Social Connections: Not on file  ? ?Outpatient Encounter Medications as of 07/03/2021  ?Medication Sig  ? Vitamin D, Ergocalciferol, (DRISDOL) 1.25 MG (50000 UNIT) CAPS capsule Take 1 capsule (50,000 Units total) by mouth every 7 (seven) days.  ? Cholecalciferol (VITAMIN D3 PO) Take 1 tablet by mouth daily in the afternoon.  ? FERRETTS 325 (106 Fe) MG TABS tablet Take 1 tablet by mouth daily.  ? levothyroxine (SYNTHROID) 100 MCG tablet Take 1 tablet (100 mcg total) by mouth daily before breakfast.  ? [DISCONTINUED] levothyroxine (SYNTHROID) 112 MCG tablet Take 1 tablet (112 mcg total) by mouth daily before breakfast.  ? ?No facility-administered encounter medications on file as of 07/03/2021.  ? ?ALLERGIES: ?No Known Allergies ? ?VACCINATION STATUS: ? ?There is no immunization history on file for this patient. ? ?Thyroid Problem ?Presents for follow-up visit. Symptoms include menstrual problem  (irregular menses with prolonged bleeding). Patient reports no anxiety, cold intolerance, constipation, depressed mood, fatigue, heat intolerance, palpitations, tremors, weight gain or weight loss. The symptoms have been improving.  ? ? ?36 year old female patient with medical history as above. She is being engaged in follow-up after she was treated with radioactive iodine treatment for Graves' disease. She has dealt with hyperthyroidism from Graves' disease for 3+ years.  She received I-131 thyroid ablation treatment on August 05, 2019.   ? ?-Her prior thyroid ultrasound did not reveal any nodular lesions. ? She denies any family history of thyroid dysfunction. ? ?Review of systems ? ?Constitutional: + Minimally fluctuating body weight,  current Body mass index is 29.05 kg/m?. , no fatigue, no subjective hyperthermia, no subjective hypothermia ?Eyes: no blurry vision, no xerophthalmia ?ENT: no sore throat, no nodules palpated in throat, no dysphagia/odynophagia, no hoarseness ?Cardiovascular: no chest pain, no shortness of breath, no palpitations, no leg swelling ?Respiratory: no cough, no shortness of breath ?Gastrointestinal: no nausea/vomiting/diarrhea ?Musculoskeletal: no muscle/joint aches ?Skin: no rashes, no hyperemia ?Neurological: no tremors, no numbness, no tingling, no dizziness ?Psychiatric: no depression, + anxiety ? ? ? ? ?Objective:  ?  ?BP 119/77   Pulse 77   Ht 5\' 3"  (1.6 m)   Wt 164 lb (74.4 kg)   BMI 29.05 kg/m?   ?Wt Readings from Last 3 Encounters:  ?07/03/21 164 lb (74.4 kg)  ?03/05/21 165 lb  12.8 oz (75.2 kg)  ?09/17/20 167 lb (75.8 kg)  ?  ?BP Readings from Last 3 Encounters:  ?07/03/21 119/77  ?03/05/21 118/81  ?09/17/20 115/71  ? ? ? ?Physical Exam- Limited ? ?Constitutional:  Body mass index is 29.05 kg/m?. , not in acute distress, normal state of mind ?Eyes:  EOMI, no exophthalmos ?Neck: Supple ?Cardiovascular: RRR, no murmurs, rubs, or gallops, no edema ?Respiratory: Adequate  breathing efforts, no crackles, rales, rhonchi, or wheezing ?Musculoskeletal: no gross deformities, strength intact in all four extremities, no gross restriction of joint movements ?Skin:  no rashes, no hyperemia ?Neurological: no tremor with outstretched hands ? ? ?Recent Results (from the past 2160 hour(s))  ?TSH     Status: Abnormal  ? Collection Time: 06/27/21  2:55 PM  ?Result Value Ref Range  ? TSH 0.028 (L) 0.450 - 4.500 uIU/mL  ?T4, free     Status: Abnormal  ? Collection Time: 06/27/21  2:55 PM  ?Result Value Ref Range  ? Free T4 1.99 (H) 0.82 - 1.77 ng/dL  ?VITAMIN D 25 Hydroxy (Vit-D Deficiency, Fractures)     Status: Abnormal  ? Collection Time: 06/27/21  2:55 PM  ?Result Value Ref Range  ? Vit D, 25-Hydroxy 17.3 (L) 30.0 - 100.0 ng/mL  ?  Comment: Vitamin D deficiency has been defined by the Institute of ?Medicine and an Endocrine Society practice guideline as a ?level of serum 25-OH vitamin D less than 20 ng/mL (1,2). ?The Endocrine Society went on to further define vitamin D ?insufficiency as a level between 21 and 29 ng/mL (2). ?1. IOM (Institute of Medicine). 2010. Dietary reference ?   intakes for calcium and D. [place]: The ?   [employer]. ?2. Holick MF, Binkley [place], Bischoff-Ferrari HA, et al. ?   Evaluation, treatment, and prevention of vitamin D ?   deficiency: an Endocrine Society clinical practice ?   guideline. JCEM. 2011 Jul; 96(7):1911-30. ?  ? ? Latest Reference Range & Units 05/08/20 15:49 09/07/20 12:14 02/22/21 15:11 06/27/21 14:55  ?TSH 0.450 - 4.500 uIU/mL 14.900 (H) 12.600 (H) 0.041 (L) 0.028 (L)  ?T4,Free(Direct) 0.82 - 1.77 ng/dL 9.38 1.82 9.93 7.16 (H)  ?(H): Data is abnormally high ?(L): Data is abnormally low ? ?Assessment & Plan:  ? ?1.  RAI induced hypothyroidism ?-She is status post radioactive iodine ablation on August 05, 2019. ? ?-Her previsit thyroid function tests are consistent with slight over-replacement.  She is advised to lower her dose of  Levothyroxine to 100 mcg po daily before breakfast.   ? ? - We discussed about the correct intake of her thyroid hormone, on empty stomach at fasting, with water, separated by at least 30 minutes from breakfast and other medications,  and separated by more than 4 hours from calcium, iron, multivitamins, acid reflux medications (PPIs). ?-Patient is made aware of the fact that thyroid hormone replacement is needed for life, dose to be adjusted by periodic monitoring of thyroid function tests. ? ?2.  Vitamin D Deficiency ?-Her most recent vitamin D level was 17.3 on 06/27/21.  She has been on Ergocalciferol in the past and has stopped taking her OTC vitamin D.  She needs to repeat therapy with Ergocalciferol 50000 units weekly x 12 weeks. She  ? ? ?- I advised patient to maintain close follow up with Jaclynn Guarneri, FNP for primary care needs. ? ? ? ?I spent 20 minutes in the care of the patient today including review of labs from Thyroid  Function, CMP, and other relevant labs ; imaging/biopsy records (current and previous including abstractions from other facilities); face-to-face time discussing  her lab results and symptoms, medications doses, her options of short and long term treatment based on the latest standards of care / guidelines;   and documenting the encounter. ? ?Kara MechAntwonette Popelka  participated in the discussions, expressed understanding, and voiced agreement with the above plans.  All questions were answered to her satisfaction. she is encouraged to contact clinic should she have any questions or concerns prior to her return visit. ? ? ?Follow up plan: ?Return in about 3 months (around 10/03/2021) for Thyroid follow up, Previsit labs. ? ?Ronny BaconWhitney Orvetta Danielski, FNP-BC ?[facility] ?[address] ?[place], [place] [address] ?Phone: [phone] ?Fax: [phone] ? ?07/03/2021, 3:40 PM ?

## 2021-07-03 NOTE — Patient Instructions (Signed)

## 2021-09-16 ENCOUNTER — Other Ambulatory Visit: Payer: Self-pay | Admitting: Nurse Practitioner

## 2021-09-17 NOTE — Telephone Encounter (Signed)
Pt said she is out of her medication and the pharmacy told her that they do not have refills on file and she is going out of town [employer]. Can you resend?

## 2021-09-27 LAB — T4, FREE: Free T4: 1.4 ng/dL (ref 0.82–1.77)

## 2021-09-27 LAB — TSH: TSH: 3.53 u[IU]/mL (ref 0.450–4.500)

## 2021-10-03 ENCOUNTER — Ambulatory Visit (INDEPENDENT_AMBULATORY_CARE_PROVIDER_SITE_OTHER): Payer: PRIVATE HEALTH INSURANCE | Admitting: Nurse Practitioner

## 2021-10-03 ENCOUNTER — Encounter: Payer: Self-pay | Admitting: Nurse Practitioner

## 2021-10-03 VITALS — BP 124/86 | HR 58 | Ht 63.0 in | Wt 166.0 lb

## 2021-10-03 DIAGNOSIS — E559 Vitamin D deficiency, unspecified: Secondary | ICD-10-CM | POA: Diagnosis not present

## 2021-10-03 DIAGNOSIS — E890 Postprocedural hypothyroidism: Secondary | ICD-10-CM

## 2021-10-03 MED ORDER — LEVOTHYROXINE SODIUM 100 MCG PO TABS: 100.0000 ug | ORAL_TABLET | Freq: Every day | ORAL | 1 refills | Status: DC

## 2021-10-03 NOTE — Patient Instructions (Signed)

## 2021-10-03 NOTE — Progress Notes (Signed)
10/03/2021  Endocrinology Follow Up Visit     Subjective:    Patient ID: Kara Bruce, female    DOB: November 07, 1985, PCP Jaclynn Guarneri, FNP   Past Medical History:  Diagnosis Date   Hyperthyroidism    History reviewed. No pertinent surgical history. Social History   Socioeconomic History   Marital status: Single    Spouse name: Not on file   Number of children: Not on file   Years of education: Not on file   Highest education level: Not on file  Occupational History   Not on file  Tobacco Use   Smoking status: Never   Smokeless tobacco: Never  Vaping Use   Vaping Use: Never used  Substance and Sexual Activity   Alcohol use: No   Drug use: No   Sexual activity: Not Currently    Birth control/protection: Abstinence  Other Topics Concern   Not on file  Social History Narrative   Not on file   Social Determinants of Health   Financial Resource Strain: Not on file  Food Insecurity: Not on file  Transportation Needs: Not on file  Physical Activity: Not on file  Stress: Not on file  Social Connections: Not on file   Outpatient Encounter Medications as of 10/03/2021  Medication Sig   Cholecalciferol (VITAMIN D3 PO) Take 1 tablet by mouth daily in the afternoon.   FERRETTS 325 (106 Fe) MG TABS tablet Take 1 tablet by mouth daily.   JUNEL FE 1/20 1-20 MG-MCG tablet Take 1 tablet by mouth daily.   levothyroxine (SYNTHROID) 100 MCG tablet Take 1 tablet (100 mcg total) by mouth daily before breakfast.   [DISCONTINUED] levothyroxine (SYNTHROID) 100 MCG tablet Take 1 tablet (100 mcg total) by mouth daily before breakfast.   [DISCONTINUED] levothyroxine (SYNTHROID) 112 MCG tablet TAKE 1 TABLET BY MOUTH DAILY BEFORE BREAKFAST.   [DISCONTINUED] Vitamin D, Ergocalciferol, (DRISDOL) 1.25 MG (50000 UNIT) CAPS capsule Take 1 capsule (50,000 Units total) by mouth every 7 (seven) days.   No facility-administered encounter medications on file as of 10/03/2021.   ALLERGIES: No  Known Allergies  VACCINATION STATUS:  There is no immunization history on file for this patient.  Thyroid Problem Presents for follow-up visit. Symptoms include menstrual problem (irregular menses with prolonged bleeding). Patient reports no anxiety, cold intolerance, constipation, depressed mood, fatigue, heat intolerance, palpitations, tremors, weight gain or weight loss. The symptoms have been improving.     36 year old female patient with medical history as above. She is being engaged in follow-up after she was treated with radioactive iodine treatment for Graves' disease. She has dealt with hyperthyroidism from Graves' disease for 3+ years.  She received I-131 thyroid ablation treatment on August 05, 2019.    -Her prior thyroid ultrasound did not reveal any nodular lesions.  She denies any family history of thyroid dysfunction.  Review of systems  Constitutional: + Minimally fluctuating body weight,  current Body mass index is 29.41 kg/m. , no fatigue, no subjective hyperthermia, no subjective hypothermia Eyes: no blurry vision, no xerophthalmia ENT: no sore throat, no nodules palpated in throat, no dysphagia/odynophagia, no hoarseness Cardiovascular: no chest pain, no shortness of breath, no palpitations, no leg swelling Respiratory: no cough, no shortness of breath Gastrointestinal: no nausea/vomiting/diarrhea Musculoskeletal: no muscle/joint aches Skin: no rashes, no hyperemia Neurological: no tremors, no numbness, no tingling, no dizziness Psychiatric: no depression, + anxiety     Objective:    BP 124/86   Pulse (!) 58   Ht 5'  3" (1.6 m)   Wt 166 lb (75.3 kg)   BMI 29.41 kg/m   Wt Readings from Last 3 Encounters:  10/03/21 166 lb (75.3 kg)  07/03/21 164 lb (74.4 kg)  03/05/21 165 lb 12.8 oz (75.2 kg)    BP Readings from Last 3 Encounters:  10/03/21 124/86  07/03/21 119/77  03/05/21 118/81     Physical Exam- Limited  Constitutional:  Body mass index is  29.41 kg/m. , not in acute distress, normal state of mind Eyes:  EOMI, no exophthalmos Neck: Supple Cardiovascular: RRR, no murmurs, rubs, or gallops, no edema Respiratory: Adequate breathing efforts, no crackles, rales, rhonchi, or wheezing Musculoskeletal: no gross deformities, strength intact in all four extremities, no gross restriction of joint movements Skin:  no rashes, no hyperemia Neurological: no tremor with outstretched hands   Recent Results (from the past 2160 hour(s))  TSH     Status: None   Collection Time: 09/26/21  2:55 PM  Result Value Ref Range   TSH 3.530 0.450 - 4.500 uIU/mL  T4, free     Status: None   Collection Time: 09/26/21  2:55 PM  Result Value Ref Range   Free T4 1.40 0.82 - 1.77 ng/dL    Latest Reference Range & Units 05/08/20 15:49 09/07/20 12:14 02/22/21 15:11 06/27/21 14:55  TSH 0.450 - 4.500 uIU/mL 14.900 (H) 12.600 (H) 0.041 (L) 0.028 (L)  T4,Free(Direct) 0.82 - 1.77 ng/dL 3.26 7.12 4.58 0.99 (H)  (H): Data is abnormally high (L): Data is abnormally low  Assessment & Plan:   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  -Her previsit thyroid function tests are consistent with slight over-replacement.  She is advised to lower her dose of Levothyroxine to 100 mcg po daily before breakfast.     - We discussed about the correct intake of her thyroid hormone, on empty stomach at fasting, with water, separated by at least 30 minutes from breakfast and other medications,  and separated by more than 4 hours from calcium, iron, multivitamins, acid reflux medications (PPIs). -Patient is made aware of the fact that thyroid hormone replacement is needed for life, dose to be adjusted by periodic monitoring of thyroid function tests.  2.  Vitamin D Deficiency -Her most recent vitamin D level was 17.3 on 06/27/21.  She has been on Ergocalciferol in the past and has stopped taking her OTC vitamin D.  She needs  to repeat therapy with Ergocalciferol 50000 units weekly x 12 weeks. She    - I advised patient to maintain close follow up with Jaclynn Guarneri, FNP for primary care needs.    I spent 20 minutes in the care of the patient today including review of labs from Thyroid Function, CMP, and other relevant labs ; imaging/biopsy records (current and previous including abstractions from other facilities); face-to-face time discussing  her lab results and symptoms, medications doses, her options of short and long term treatment based on the latest standards of care / guidelines;   and documenting the encounter.  Adaly Clegg  participated in the discussions, expressed understanding, and voiced agreement with the above plans.  All questions were answered to her satisfaction. she is encouraged to contact clinic should she have any questions or concerns prior to her return visit.   Follow up plan: No follow-ups on file.  Ronny Bacon, [facility] [address] [place], [place] [address] Phone: [phone] Fax: [phone]  10/03/2021,  3:44 PM

## 2021-10-03 NOTE — Progress Notes (Signed)
10/03/2021  Endocrinology Follow Up Visit     Subjective:    Patient ID: Kara Bruce, female    DOB: 11-07-1985, PCP Jaclynn Guarneri, FNP   Past Medical History:  Diagnosis Date   Hyperthyroidism    History reviewed. No pertinent surgical history. Social History   Socioeconomic History   Marital status: Single    Spouse name: Not on file   Number of children: Not on file   Years of education: Not on file   Highest education level: Not on file  Occupational History   Not on file  Tobacco Use   Smoking status: Never   Smokeless tobacco: Never  Vaping Use   Vaping Use: Never used  Substance and Sexual Activity   Alcohol use: No   Drug use: No   Sexual activity: Not Currently    Birth control/protection: Abstinence  Other Topics Concern   Not on file  Social History Narrative   Not on file   Social Determinants of Health   Financial Resource Strain: Not on file  Food Insecurity: Not on file  Transportation Needs: Not on file  Physical Activity: Not on file  Stress: Not on file  Social Connections: Not on file   Outpatient Encounter Medications as of 10/03/2021  Medication Sig   Cholecalciferol (VITAMIN D3 PO) Take 1 tablet by mouth daily in the afternoon.   FERRETTS 325 (106 Fe) MG TABS tablet Take 1 tablet by mouth daily.   JUNEL FE 1/20 1-20 MG-MCG tablet Take 1 tablet by mouth daily.   levothyroxine (SYNTHROID) 100 MCG tablet Take 1 tablet (100 mcg total) by mouth daily before breakfast.   [DISCONTINUED] levothyroxine (SYNTHROID) 100 MCG tablet Take 1 tablet (100 mcg total) by mouth daily before breakfast.   [DISCONTINUED] levothyroxine (SYNTHROID) 112 MCG tablet TAKE 1 TABLET BY MOUTH DAILY BEFORE BREAKFAST.   [DISCONTINUED] Vitamin D, Ergocalciferol, (DRISDOL) 1.25 MG (50000 UNIT) CAPS capsule Take 1 capsule (50,000 Units total) by mouth every 7 (seven) days.   No facility-administered encounter medications on file as of 10/03/2021.   ALLERGIES: No  Known Allergies  VACCINATION STATUS:  There is no immunization history on file for this patient.  Thyroid Problem Presents for follow-up visit. Symptoms include menstrual problem (irregular menses with prolonged bleeding- just started Junel). Patient reports no anxiety, cold intolerance, constipation, depressed mood, fatigue, heat intolerance, palpitations, tremors, weight gain or weight loss. The symptoms have been improving.     36 year old female patient with medical history as above. She is being engaged in follow-up after she was treated with radioactive iodine treatment for Graves' disease. She has dealt with hyperthyroidism from Graves' disease for 3+ years.  She received I-131 thyroid ablation treatment on August 05, 2019.    -Her prior thyroid ultrasound did not reveal any nodular lesions.  She denies any family history of thyroid dysfunction.  Review of systems  Constitutional: + Minimally fluctuating body weight,  current Body mass index is 29.41 kg/m. , no fatigue, no subjective hyperthermia, no subjective hypothermia Eyes: no blurry vision, no xerophthalmia ENT: no sore throat, no nodules palpated in throat, no dysphagia/odynophagia, no hoarseness Cardiovascular: no chest pain, no shortness of breath, no palpitations, no leg swelling Respiratory: no cough, no shortness of breath Gastrointestinal: no nausea/vomiting/diarrhea Musculoskeletal: no muscle/joint aches Skin: no rashes, no hyperemia Neurological: no tremors, no numbness, no tingling, no dizziness Psychiatric: no depression, + anxiety     Objective:    BP 124/86   Pulse (!) 58  Ht 5\' 3"  (1.6 m)   Wt 166 lb (75.3 kg)   BMI 29.41 kg/m   Wt Readings from Last 3 Encounters:  10/03/21 166 lb (75.3 kg)  07/03/21 164 lb (74.4 kg)  03/05/21 165 lb 12.8 oz (75.2 kg)    BP Readings from Last 3 Encounters:  10/03/21 124/86  07/03/21 119/77  03/05/21 118/81     Physical Exam- Limited  Constitutional:   Body mass index is 29.41 kg/m. , not in acute distress, normal state of mind Eyes:  EOMI, no exophthalmos Neck: Supple Cardiovascular: RRR, no murmurs, rubs, or gallops, no edema Respiratory: Adequate breathing efforts, no crackles, rales, rhonchi, or wheezing Musculoskeletal: no gross deformities, strength intact in all four extremities, no gross restriction of joint movements Skin:  no rashes, no hyperemia Neurological: no tremor with outstretched hands   Recent Results (from the past 2160 hour(s))  TSH     Status: None   Collection Time: 09/26/21  2:55 PM  Result Value Ref Range   TSH 3.530 0.450 - 4.500 uIU/mL  T4, free     Status: None   Collection Time: 09/26/21  2:55 PM  Result Value Ref Range   Free T4 1.40 0.82 - 1.77 ng/dL    Latest Reference Range & Units 09/07/20 12:14 02/22/21 15:11 06/27/21 14:55 09/26/21 14:55  TSH 0.450 - 4.500 uIU/mL 12.600 (H) 0.041 (L) 0.028 (L) 3.530  T4,Free(Direct) 0.82 - 1.77 ng/dL 11/26/21 2.35 5.73 (H) 2.20  (H): Data is abnormally high (L): Data is abnormally low  Assessment & Plan:   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  -Her previsit thyroid function tests are consistent with appropriate hormone replacement.  She is advised to continue her dose of Levothyroxine 100 mcg po daily before breakfast.     - We discussed about the correct intake of her thyroid hormone, on empty stomach at fasting, with water, separated by at least 30 minutes from breakfast and other medications,  and separated by more than 4 hours from calcium, iron, multivitamins, acid reflux medications (PPIs). -Patient is made aware of the fact that thyroid hormone replacement is needed for life, dose to be adjusted by periodic monitoring of thyroid function tests.  2.  Vitamin D Deficiency -Her most recent vitamin D level was 17.3 on 06/27/21.  She has been on Ergocalciferol in the past and has stopped taking her OTC  vitamin D.  Will recheck Vitamin D prior to next visit.   - I advised patient to maintain close follow up with 08/27/21, FNP for primary care needs.   I spent 20 minutes in the care of the patient today including review of labs from Thyroid Function, CMP, and other relevant labs ; imaging/biopsy records (current and previous including abstractions from other facilities); face-to-face time discussing  her lab results and symptoms, medications doses, her options of short and long term treatment based on the latest standards of care / guidelines;   and documenting the encounter.  Shanine Habenicht  participated in the discussions, expressed understanding, and voiced agreement with the above plans.  All questions were answered to her satisfaction. she is encouraged to contact clinic should she have any questions or concerns prior to her return visit.   Follow up plan: Return in about 4 months (around 02/02/2022) for Thyroid follow up, Previsit labs.  02/04/2022, [facility] [address] [place], [place] Phone: [phone] Fax: [phone]  10/03/2021, 3:48 PM

## 2022-01-31 NOTE — Patient Instructions (Signed)

## 2022-02-01 LAB — VITAMIN D 25 HYDROXY (VIT D DEFICIENCY, FRACTURES): Vit D, 25-Hydroxy: 24.1 ng/mL — ABNORMAL LOW (ref 30.0–100.0)

## 2022-02-01 LAB — T4, FREE: Free T4: 1.49 ng/dL (ref 0.82–1.77)

## 2022-02-01 LAB — TSH: TSH: 1.72 u[IU]/mL (ref 0.450–4.500)

## 2022-02-04 ENCOUNTER — Encounter: Payer: Self-pay | Admitting: Nurse Practitioner

## 2022-02-04 ENCOUNTER — Ambulatory Visit (INDEPENDENT_AMBULATORY_CARE_PROVIDER_SITE_OTHER): Payer: PRIVATE HEALTH INSURANCE | Admitting: Nurse Practitioner

## 2022-02-04 VITALS — Ht 63.0 in

## 2022-02-04 DIAGNOSIS — E890 Postprocedural hypothyroidism: Secondary | ICD-10-CM | POA: Diagnosis not present

## 2022-02-04 DIAGNOSIS — E559 Vitamin D deficiency, unspecified: Secondary | ICD-10-CM

## 2022-02-04 MED ORDER — LEVOTHYROXINE SODIUM 100 MCG PO TABS: 100.0000 ug | ORAL_TABLET | Freq: Every day | ORAL | 1 refills | Status: DC

## 2022-02-04 NOTE — Progress Notes (Signed)
02/04/2022  Endocrinology Follow Up Visit   TELEHEALTH VISIT: The patient is being engaged in telehealth visit.  This type of visit limits physical examination significantly, and thus is not preferable over face-to-face encounters.  I connected with  Kara Bruce on 02/04/22 by a video enabled telemedicine application and verified that I am speaking with the correct person using two identifiers.   I discussed the limitations of evaluation and management by telemedicine. The patient expressed understanding and agreed to proceed.    The participants involved in this visit include: Dani Gobble, NP located at [facility] and Kara Bruce  located at their personal residence listed.    Subjective:    Patient ID: Kara Bruce, female    DOB: 1985/09/26, PCP Jaclynn Guarneri, FNP   Past Medical History:  Diagnosis Date   Hyperthyroidism    History reviewed. No pertinent surgical history. Social History   Socioeconomic History   Marital status: Single    Spouse name: Not on file   Number of children: Not on file   Years of education: Not on file   Highest education level: Not on file  Occupational History   Not on file  Tobacco Use   Smoking status: Never   Smokeless tobacco: Never  Vaping Use   Vaping Use: Never used  Substance and Sexual Activity   Alcohol use: No   Drug use: No   Sexual activity: Not Currently    Birth control/protection: Abstinence  Other Topics Concern   Not on file  Social History Narrative   Not on file   Social Determinants of Health   Financial Resource Strain: Not on file  Food Insecurity: Not on file  Transportation Needs: Not on file  Physical Activity: Not on file  Stress: Not on file  Social Connections: Not on file   Outpatient Encounter Medications as of 02/04/2022  Medication Sig   ferrous sulfate 325 (65 FE) MG EC tablet Take 325 mg by mouth. Patient states that she only takes this 1  to 2 times weekly.   JUNEL FE 1/20 1-20 MG-MCG tablet Take 1 tablet by mouth daily.   [DISCONTINUED] levothyroxine (SYNTHROID) 100 MCG tablet Take 1 tablet (100 mcg total) by mouth daily before breakfast.   Cholecalciferol (VITAMIN D3 PO) Take 1 tablet by mouth daily in the afternoon. (Patient not taking: Reported on 02/04/2022)   FERRETTS 325 (106 Fe) MG TABS tablet Take 1 tablet by mouth daily. (Patient not taking: Reported on 02/04/2022)   levothyroxine (SYNTHROID) 100 MCG tablet Take 1 tablet (100 mcg total) by mouth daily before breakfast.   No facility-administered encounter medications on file as of 02/04/2022.   ALLERGIES: No Known Allergies  VACCINATION STATUS:  There is no immunization history on file for this patient.  Thyroid Problem Presents for follow-up visit. Symptoms include menstrual problem (irregular menses with prolonged bleeding- just started Junel). Patient reports no anxiety, cold intolerance, constipation, depressed mood, fatigue, heat intolerance, palpitations, tremors, weight gain or weight loss. The symptoms have been improving.     36 year old female patient with medical history as above. She is being engaged in follow-up after she was treated with radioactive iodine treatment for Graves' disease. She has dealt with hyperthyroidism from Graves' disease for 3+ years.  She received I-131 thyroid ablation treatment on August 05, 2019.    -Her prior thyroid ultrasound did not reveal any nodular lesions.  She denies any family history of thyroid dysfunction.  Review of systems  Constitutional: +  Minimally fluctuating body weight,  current Body mass index is 29.41 kg/m. , no fatigue, no subjective hyperthermia, no subjective hypothermia Eyes: no blurry vision, no xerophthalmia ENT: no sore throat, no nodules palpated in throat, no dysphagia/odynophagia, no hoarseness Cardiovascular: no chest pain, no shortness of breath, no palpitations, no leg  swelling Respiratory: no cough, no shortness of breath Gastrointestinal: no nausea/vomiting/diarrhea Musculoskeletal: no muscle/joint aches Skin: no rashes, no hyperemia Neurological: no tremors, no numbness, no tingling, no dizziness Psychiatric: no depression, + anxiety- improved     Objective:    Ht 5\' 3"  (1.6 m)   BMI 29.41 kg/m   Wt Readings from Last 3 Encounters:  10/03/21 166 lb (75.3 kg)  07/03/21 164 lb (74.4 kg)  03/05/21 165 lb 12.8 oz (75.2 kg)    BP Readings from Last 3 Encounters:  10/03/21 124/86  07/03/21 119/77  03/05/21 118/81    Physical Exam- Telehealth- significantly limited due to nature of visit  Constitutional: Body mass index is 29.41 kg/m. , not in acute distress, normal state of mind Respiratory: Adequate breathing efforts   Recent Results (from the past 2160 hour(s))  TSH     Status: None   Collection Time: 01/31/22  2:58 PM  Result Value Ref Range   TSH 1.720 0.450 - 4.500 uIU/mL  T4, free     Status: None   Collection Time: 01/31/22  2:58 PM  Result Value Ref Range   Free T4 1.49 0.82 - 1.77 ng/dL  VITAMIN D 25 Hydroxy (Vit-D Deficiency, Fractures)     Status: Abnormal   Collection Time: 01/31/22  2:58 PM  Result Value Ref Range   Vit D, 25-Hydroxy 24.1 (L) 30.0 - 100.0 ng/mL    Comment: Vitamin D deficiency has been defined by the Institute of Medicine and an Endocrine Society practice guideline as a level of serum 25-OH vitamin D less than 20 ng/mL (1,2). The Endocrine Society went on to further define vitamin D insufficiency as a level between 21 and 29 ng/mL (2). 1. IOM (Institute of Medicine). 2010. Dietary reference    intakes for calcium and D. [place]: The    2011. 2. Holick MF, Binkley [place], Bischoff-Ferrari HA, et al.    Evaluation, treatment, and prevention of vitamin D    deficiency: an Endocrine Society clinical practice    guideline. JCEM. 2011 Jul; 96(7):1911-30.     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    -Her previsit thyroid function tests are consistent with appropriate hormone replacement.  She is advised to continue her dose of Levothyroxine 100 mcg po daily before breakfast.   - We discussed about the correct intake of her thyroid hormone, on empty stomach at fasting, with water, separated by at least 30 minutes from breakfast and other medications,  and separated by more than 4 hours from calcium, iron, multivitamins, acid reflux medications (PPIs). -Patient is made aware of the fact that thyroid hormone replacement is needed for life, dose to be adjusted by periodic monitoring of thyroid function tests.  2.  Vitamin D  Deficiency -Her most recent vitamin D level was 24.1 on 01/31/22.  She has been on Ergocalciferol in the past and has stopped taking her OTC vitamin D routinely.  I did advise her to restart taking it daily, especially during the winter months.   - I advised patient to maintain close follow up with Jaclynn Guarneri, FNP for primary care needs.    I spent 20 minutes dedicated to the care of this patient on the date of this encounter to include pre-visit review of records, face-to-face time with the patient, and post visit ordering of testing.  Kara Bruce  participated in the discussions, expressed understanding, and voiced agreement with the above plans.  All questions were answered to her satisfaction. she is encouraged to contact clinic should she have any questions or concerns prior to her return visit.   Follow up plan: Return in  about 6 months (around 08/06/2022) for Thyroid follow up, Virtual visit ok, Previsit labs.  Ronny Bacon, [facility] [address] [place], [place] [address] Phone: [phone] Fax: [phone]  02/04/2022, 3:18 PM

## 2022-02-21 ENCOUNTER — Other Ambulatory Visit: Payer: Self-pay | Admitting: Nurse Practitioner

## 2022-02-25 ENCOUNTER — Ambulatory Visit: Payer: PRIVATE HEALTH INSURANCE | Admitting: Nurse Practitioner

## 2022-03-26 ENCOUNTER — Other Ambulatory Visit: Payer: Self-pay | Admitting: Nurse Practitioner

## 2022-08-13 ENCOUNTER — Ambulatory Visit: Payer: PRIVATE HEALTH INSURANCE | Admitting: Nurse Practitioner

## 2022-10-07 NOTE — Patient Instructions (Signed)

## 2022-10-09 ENCOUNTER — Encounter: Payer: Self-pay | Admitting: Nurse Practitioner

## 2022-10-09 ENCOUNTER — Ambulatory Visit (INDEPENDENT_AMBULATORY_CARE_PROVIDER_SITE_OTHER): Payer: PRIVATE HEALTH INSURANCE | Admitting: Nurse Practitioner

## 2022-10-09 VITALS — BP 123/82 | HR 71 | Ht 63.0 in | Wt 173.4 lb

## 2022-10-09 DIAGNOSIS — E890 Postprocedural hypothyroidism: Secondary | ICD-10-CM

## 2022-10-09 DIAGNOSIS — E559 Vitamin D deficiency, unspecified: Secondary | ICD-10-CM

## 2022-10-09 MED ORDER — LEVOTHYROXINE SODIUM 100 MCG PO TABS: 100.0000 ug | ORAL_TABLET | Freq: Every day | ORAL | 1 refills | Status: DC

## 2022-10-09 NOTE — Progress Notes (Signed)
10/09/2022  Endocrinology Follow Up Visit    Subjective:    Patient ID: Kara Bruce, female    DOB: July 20, 1985, PCP Jaclynn Guarneri, FNP   Past Medical History:  Diagnosis Date   Hyperthyroidism    History reviewed. No pertinent surgical history. Social History   Socioeconomic History   Marital status: Single    Spouse name: Not on file   Number of children: Not on file   Years of education: Not on file   Highest education level: Not on file  Occupational History   Not on file  Tobacco Use   Smoking status: Never   Smokeless tobacco: Never  Vaping Use   Vaping status: Never Used  Substance and Sexual Activity   Alcohol use: No   Drug use: No   Sexual activity: Not Currently    Birth control/protection: Abstinence  Other Topics Concern   Not on file  Social History Narrative   Not on file   Social Determinants of Health   Financial Resource Strain: Not on file  Food Insecurity: Not on file  Transportation Needs: Not on file  Physical Activity: Not on file  Stress: Not on file  Social Connections: Not on file   Outpatient Encounter Medications as of 10/09/2022  Medication Sig   JUNEL FE 1/20 1-20 MG-MCG tablet Take 1 tablet by mouth daily.   [DISCONTINUED] levothyroxine (SYNTHROID) 100 MCG tablet Take 1 tablet (100 mcg total) by mouth daily before breakfast.   Cholecalciferol (VITAMIN D3 PO) Take 1 tablet by mouth daily in the afternoon. (Patient not taking: Reported on 10/09/2022)   FERRETTS 325 (106 Fe) MG TABS tablet Take 1 tablet by mouth daily. (Patient not taking: Reported on 02/04/2022)   ferrous sulfate 325 (65 FE) MG EC tablet Take 325 mg by mouth. Patient states that she only takes this 1 to 2 times weekly. (Patient not taking: Reported on 10/09/2022)   levothyroxine (SYNTHROID) 100 MCG tablet Take 1 tablet (100 mcg total) by mouth daily before breakfast.   Vitamin D, Ergocalciferol, (DRISDOL) 1.25 MG (50000 UNIT) CAPS capsule TAKE 1 CAPSULE  (50,000 UNITS TOTAL) BY MOUTH EVERY 7 (SEVEN) DAYS (Patient not taking: Reported on 10/09/2022)   No facility-administered encounter medications on file as of 10/09/2022.   ALLERGIES: No Known Allergies  VACCINATION STATUS:  There is no immunization history on file for this patient.  Thyroid Problem Presents for follow-up visit. Symptoms include menstrual problem (irregular menses with prolonged bleeding- just started Junel). Patient reports no anxiety, cold intolerance, constipation, depressed mood, fatigue, heat intolerance, palpitations, tremors, weight gain or weight loss. The symptoms have been improving.     37 year old female patient with medical history as above. She is being engaged in follow-up after she was treated with radioactive iodine treatment for Graves' disease. She has dealt with hyperthyroidism from Graves' disease for 3+ years.  She received I-131 thyroid ablation treatment on August 05, 2019.    -Her prior thyroid ultrasound did not reveal any nodular lesions.  She denies any family history of thyroid dysfunction.  Review of systems  Constitutional: + increasing body weight,  current Body mass index is 30.72 kg/m. , + fatigue, no subjective hyperthermia, no subjective hypothermia Eyes: no blurry vision, no xerophthalmia ENT: no sore throat, no nodules palpated in throat, no dysphagia/odynophagia, no hoarseness Cardiovascular: no chest pain, no shortness of breath, no palpitations, no leg swelling Respiratory: no cough, no shortness of breath Gastrointestinal: no nausea/vomiting/diarrhea Musculoskeletal: + diffuse muscle/joint aches Skin: no  rashes, no hyperemia Neurological: no tremors, no numbness, no tingling, no dizziness Psychiatric: no depression, no anxiety     Objective:    BP 123/82 (BP Location: Left Arm, Patient Position: Sitting, Cuff Size: Large)   Pulse 71   Ht 5\' 3"  (1.6 m)   Wt 173 lb 6.4 oz (78.7 kg)   BMI 30.72 kg/m   Wt Readings from  Last 3 Encounters:  10/09/22 173 lb 6.4 oz (78.7 kg)  10/03/21 166 lb (75.3 kg)  07/03/21 164 lb (74.4 kg)    BP Readings from Last 3 Encounters:  10/09/22 123/82  10/03/21 124/86  07/03/21 119/77     Physical Exam- Limited  Constitutional:  Body mass index is 30.72 kg/m. , not in acute distress, normal state of mind Eyes:  EOMI, no exophthalmos Musculoskeletal: no gross deformities, strength intact in all four extremities, no gross restriction of joint movements Skin:  no rashes, no hyperemia Neurological: no tremor with outstretched hands   Recent Results (from the past 2160 hour(s))  TSH     Status: Abnormal   Collection Time: 10/03/22  2:46 PM  Result Value Ref Range   TSH 7.140 (H) 0.450 - 4.500 uIU/mL  T4, free     Status: None   Collection Time: 10/03/22  2:46 PM  Result Value Ref Range   Free T4 1.20 0.82 - 1.77 ng/dL  VITAMIN D 25 Hydroxy (Vit-D Deficiency, Fractures)     Status: Abnormal   Collection Time: 10/03/22  2:46 PM  Result Value Ref Range   Vit D, 25-Hydroxy 27.5 (L) 30.0 - 100.0 ng/mL    Comment: Vitamin D deficiency has been defined by the Institute of Medicine and an Endocrine Society practice guideline as a level of serum 25-OH vitamin D less than 20 ng/mL (1,2). The Endocrine Society went on to further define vitamin D insufficiency as a level between 21 and 29 ng/mL (2). 1. IOM (Institute of Medicine). 2010. Dietary reference    intakes for calcium and D. [place]: The    [employer]. 2. Holick MF, Binkley [place], Bischoff-Ferrari HA, et al.    Evaluation, treatment, and prevention of vitamin D    deficiency: an Endocrine Society clinical practice    guideline. JCEM. 2011 Jul; 96(7):1911-30.     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    -Her previsit thyroid function tests are consistent with under-replacement but she admits she has not been taking it routinely.  She is advised to restart her Levothyroxine 100 mcg po daily before breakfast.    - We discussed about the correct intake of her thyroid hormone, on empty stomach at fasting, with water, separated by at least 30 minutes from breakfast and other medications,  and separated by more than 4 hours from calcium, iron, multivitamins, acid reflux medications (PPIs). -Patient is made aware of the fact that thyroid hormone replacement is needed for life, dose to be adjusted by periodic monitoring of thyroid function tests.  2.  Vitamin D  Deficiency -Her most recent vitamin D level was 27.5 on 10/03/22.  She has been on Ergocalciferol in the past and has stopped taking her OTC vitamin D routinely.  I did advise her to restart taking it daily,  Vitamin D3 5000 units daily.   - I advised patient to maintain close follow up with Jaclynn Guarneri, FNP for primary care needs.    I spent  12  minutes in the care of the patient today including review of labs from Thyroid Function, CMP, and other relevant labs ; imaging/biopsy records (current and previous including abstractions from other facilities); face-to-face time discussing  her lab results and symptoms, medications doses, her options of short and long term treatment based on the latest standards of care / guidelines;   and documenting the encounter.  Inessa Brownley  participated in the discussions, expressed understanding, and voiced agreement with the above plans.  All questions were answered to her satisfaction. she is encouraged to contact clinic should she have any  questions or concerns prior to her return visit.   Follow up plan: Return in about 4 months (around 02/08/2023) for Thyroid follow up, Previsit labs.  Ronny Bacon, [facility] [address] [place], [place] [address] Phone: [phone] Fax: [phone]  10/09/2022, 11:34 AM

## 2023-01-30 LAB — T4, FREE: Free T4: 1.2 ng/dL (ref 0.82–1.77)

## 2023-01-30 LAB — TSH: TSH: 12.8 u[IU]/mL — ABNORMAL HIGH (ref 0.450–4.500)

## 2023-02-03 NOTE — Patient Instructions (Signed)

## 2023-02-04 ENCOUNTER — Encounter: Payer: Self-pay | Admitting: Nurse Practitioner

## 2023-02-04 ENCOUNTER — Ambulatory Visit (INDEPENDENT_AMBULATORY_CARE_PROVIDER_SITE_OTHER): Payer: PRIVATE HEALTH INSURANCE | Admitting: Nurse Practitioner

## 2023-02-04 VITALS — BP 118/76 | HR 102 | Ht 62.0 in | Wt 174.6 lb

## 2023-02-04 DIAGNOSIS — E559 Vitamin D deficiency, unspecified: Secondary | ICD-10-CM

## 2023-02-04 DIAGNOSIS — E890 Postprocedural hypothyroidism: Secondary | ICD-10-CM

## 2023-02-04 MED ORDER — LEVOTHYROXINE SODIUM 112 MCG PO TABS: 112.0000 ug | ORAL_TABLET | Freq: Every day | ORAL | 1 refills | Status: DC

## 2023-02-04 NOTE — Progress Notes (Signed)
02/04/2023  Endocrinology Follow Up Visit    Subjective:    Patient ID: Kara Bruce, female    DOB: 02/13/1986, PCP Jaclynn Guarneri, FNP   Past Medical History:  Diagnosis Date   Hyperthyroidism    History reviewed. No pertinent surgical history. Social History   Socioeconomic History   Marital status: Single    Spouse name: Not on file   Number of children: Not on file   Years of education: Not on file   Highest education level: Not on file  Occupational History   Not on file  Tobacco Use   Smoking status: Never   Smokeless tobacco: Never  Vaping Use   Vaping status: Never Used  Substance and Sexual Activity   Alcohol use: No   Drug use: No   Sexual activity: Not Currently    Birth control/protection: Abstinence  Other Topics Concern   Not on file  Social History Narrative   Not on file   Social Drivers of Health   Financial Resource Strain: Not on file  Food Insecurity: Not on file  Transportation Needs: Not on file  Physical Activity: Not on file  Stress: Not on file  Social Connections: Not on file   Outpatient Encounter Medications as of 02/04/2023  Medication Sig   Cholecalciferol (VITAMIN D3 PO) Take 1 tablet by mouth daily in the afternoon.   FERRETTS 325 (106 Fe) MG TABS tablet Take 1 tablet by mouth daily.   JUNEL FE 1/20 1-20 MG-MCG tablet Take 1 tablet by mouth daily.   [DISCONTINUED] levothyroxine (SYNTHROID) 100 MCG tablet Take 1 tablet (100 mcg total) by mouth daily before breakfast.   ferrous sulfate 325 (65 FE) MG EC tablet Take 325 mg by mouth. Patient states that she only takes this 1 to 2 times weekly. (Patient not taking: Reported on 02/04/2023)   levothyroxine (SYNTHROID) 112 MCG tablet Take 1 tablet (112 mcg total) by mouth daily before breakfast.   Vitamin D, Ergocalciferol, (DRISDOL) 1.25 MG (50000 UNIT) CAPS capsule TAKE 1 CAPSULE (50,000 UNITS TOTAL) BY MOUTH EVERY 7 (SEVEN) DAYS (Patient not taking: Reported on 02/04/2023)    No facility-administered encounter medications on file as of 02/04/2023.   ALLERGIES: No Known Allergies  VACCINATION STATUS:  There is no immunization history on file for this patient.  Thyroid Problem Presents for follow-up visit. Symptoms include menstrual problem (irregular menses with prolonged bleeding- just started Junel). Patient reports no anxiety, cold intolerance, constipation, depressed mood, fatigue, heat intolerance, palpitations, tremors, weight gain or weight loss. The symptoms have been improving.     37 year old female patient with medical history as above. She is being engaged in follow-up after she was treated with radioactive iodine treatment for Graves' disease. She has dealt with hyperthyroidism from Graves' disease for 3+ years.  She received I-131 thyroid ablation treatment on August 05, 2019.    -Her prior thyroid ultrasound did not reveal any nodular lesions.  She denies any family history of thyroid dysfunction.  Review of systems  Constitutional: + increasing body weight,  current Body mass index is 31.93 kg/m. , + fatigue, no subjective hyperthermia, no subjective hypothermia Eyes: no blurry vision, no xerophthalmia ENT: no sore throat, no nodules palpated in throat, no dysphagia/odynophagia, no hoarseness Cardiovascular: no chest pain, no shortness of breath, no palpitations, no leg swelling Respiratory: no cough, no shortness of breath Gastrointestinal: no nausea/vomiting/diarrhea Musculoskeletal: + diffuse muscle/joint aches Skin: no rashes, no hyperemia Neurological: no tremors, no numbness, no tingling, no dizziness  Psychiatric: no depression, no anxiety     Objective:    BP 118/76 (BP Location: Left Arm, Patient Position: Sitting)   Pulse (!) 102   Ht 5\' 2"  (1.575 m)   Wt 174 lb 9.6 oz (79.2 kg)   BMI 31.93 kg/m   Wt Readings from Last 3 Encounters:  02/04/23 174 lb 9.6 oz (79.2 kg)  10/09/22 173 lb 6.4 oz (78.7 kg)  10/03/21 166 lb  (75.3 kg)    BP Readings from Last 3 Encounters:  02/04/23 118/76  10/09/22 123/82  10/03/21 124/86      Physical Exam- Limited  Constitutional:  Body mass index is 31.93 kg/m. , not in acute distress, normal state of mind Eyes:  EOMI, no exophthalmos Musculoskeletal: no gross deformities, strength intact in all four extremities, no gross restriction of joint movements Skin:  no rashes, no hyperemia Neurological: no tremor with outstretched hands   Recent Results (from the past 2160 hours)  TSH     Status: Abnormal   Collection Time: 01/29/23  2:23 PM  Result Value Ref Range   TSH 12.800 (H) 0.450 - 4.500 uIU/mL  T4, free     Status: None   Collection Time: 01/29/23  2:23 PM  Result Value Ref Range   Free T4 1.20 0.82 - 1.77 ng/dL    Latest Reference Range & Units 02/22/21 15:11 06/27/21 14:55 09/26/21 14:55 01/31/22 14:58 10/03/22 14:46 01/29/23 14:23  TSH 0.450 - 4.500 uIU/mL 0.041 (L) 0.028 (L) 3.530 1.720 7.140 (H) 12.800 (H)  T4,Free(Direct) 0.82 - 1.77 ng/dL 1.61 0.96 (H) 0.45 4.09 1.20 1.20  (L): Data is abnormally low (H): Data is abnormally high  Assessment & Plan:   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  -Her previsit thyroid function tests are consistent with under-replacement and she has done better with taking it consistently, therefore will increase her Levothyroxine to 112 mcg po daily before breakfast (weight based maximum is around 126 mcg).  Will recheck TFTs in 3 months and adjust dose further if needed.   - We discussed about the correct intake of her thyroid hormone, on empty stomach at fasting, with water, separated by at least 30 minutes from breakfast and other medications,  and separated by more than 4 hours from calcium, iron, multivitamins, acid reflux medications (PPIs). -Patient is made aware of the fact that thyroid hormone replacement is needed for life, dose to be adjusted by  periodic monitoring of thyroid function tests.  2.  Vitamin D Deficiency -Her most recent vitamin D level was 27.5 on 10/03/22.  She has been on Ergocalciferol in the past and is currently on OTC Vitamin D3 5000 units daily.  Will recheck Vitamin D prior to next visit.   - I advised patient to maintain close follow up with Jaclynn Guarneri, FNP for primary care needs.    I spent  20  minutes in the care of the patient today including review of labs from Thyroid Function, CMP, and other relevant labs ; imaging/biopsy records (current and previous including abstractions from other facilities); face-to-face time discussing  her lab results and symptoms, medications doses, her options of short and long term treatment based on the latest standards of care / guidelines;   and documenting the encounter.  Lillieanna Ewalt  participated in the discussions, expressed understanding, and voiced agreement with the above plans.  All questions were answered to her satisfaction. she is encouraged to contact  clinic should she have any questions or concerns prior to her return visit.   Follow up plan: Return in about 3 months (around 05/05/2023) for Thyroid follow up, Previsit labs.  Ronny Bacon, [facility] [address] [place], [place] [address] Phone: [phone] Fax: [phone]  02/04/2023, 1:17 PM

## 2023-05-01 LAB — TSH: TSH: 1.94 u[IU]/mL (ref 0.450–4.500)

## 2023-05-01 LAB — T4, FREE: Free T4: 1.46 ng/dL (ref 0.82–1.77)

## 2023-05-01 LAB — VITAMIN D 25 HYDROXY (VIT D DEFICIENCY, FRACTURES): Vit D, 25-Hydroxy: 34.2 ng/mL (ref 30.0–100.0)

## 2023-05-07 NOTE — Patient Instructions (Signed)

## 2023-05-08 ENCOUNTER — Encounter: Payer: Self-pay | Admitting: Nurse Practitioner

## 2023-05-08 ENCOUNTER — Ambulatory Visit (INDEPENDENT_AMBULATORY_CARE_PROVIDER_SITE_OTHER): Payer: PRIVATE HEALTH INSURANCE | Admitting: Nurse Practitioner

## 2023-05-08 VITALS — BP 110/82 | HR 88 | Ht 62.0 in | Wt 169.2 lb

## 2023-05-08 DIAGNOSIS — E559 Vitamin D deficiency, unspecified: Secondary | ICD-10-CM

## 2023-05-08 DIAGNOSIS — E890 Postprocedural hypothyroidism: Secondary | ICD-10-CM

## 2023-05-08 MED ORDER — LEVOTHYROXINE SODIUM 112 MCG PO TABS: 112.0000 ug | ORAL_TABLET | Freq: Every day | ORAL | 1 refills | Status: DC

## 2023-05-08 NOTE — Progress Notes (Signed)
 05/08/2023  Endocrinology Follow Up Visit    Subjective:    Patient ID: Kara Bruce, female    DOB: 08-22-1985, PCP Jaclynn Guarneri, FNP   Past Medical History:  Diagnosis Date   Hyperthyroidism    No past surgical history on file. Social History   Socioeconomic History   Marital status: Single    Spouse name: Not on file   Number of children: Not on file   Years of education: Not on file   Highest education level: Not on file  Occupational History   Not on file  Tobacco Use   Smoking status: Never   Smokeless tobacco: Never  Vaping Use   Vaping status: Never Used  Substance and Sexual Activity   Alcohol use: No   Drug use: No   Sexual activity: Not Currently    Birth control/protection: Abstinence  Other Topics Concern   Not on file  Social History Narrative   Not on file   Social Drivers of Health   Financial Resource Strain: Not on file  Food Insecurity: Not on file  Transportation Needs: Not on file  Physical Activity: Not on file  Stress: Not on file  Social Connections: Not on file   Outpatient Encounter Medications as of 05/08/2023  Medication Sig   Cholecalciferol (VITAMIN D3 PO) Take 1 tablet by mouth daily in the afternoon.   FERRETTS 325 (106 Fe) MG TABS tablet Take 1 tablet by mouth daily.   ferrous sulfate 325 (65 FE) MG EC tablet Take 325 mg by mouth. Patient states that she only takes this 1 to 2 times weekly. (Patient not taking: Reported on 02/04/2023)   JUNEL FE 1/20 1-20 MG-MCG tablet Take 1 tablet by mouth daily.   levothyroxine (SYNTHROID) 112 MCG tablet Take 1 tablet (112 mcg total) by mouth daily before breakfast.   Vitamin D, Ergocalciferol, (DRISDOL) 1.25 MG (50000 UNIT) CAPS capsule TAKE 1 CAPSULE (50,000 UNITS TOTAL) BY MOUTH EVERY 7 (SEVEN) DAYS (Patient not taking: Reported on 02/04/2023)   No facility-administered encounter medications on file as of 05/08/2023.   ALLERGIES: No Known Allergies  VACCINATION  STATUS:  There is no immunization history on file for this patient.  Thyroid Problem Presents for follow-up visit. Symptoms include menstrual problem (irregular menses with prolonged bleeding- just started Junel). Patient reports no anxiety, cold intolerance, constipation, depressed mood, fatigue, heat intolerance, palpitations, tremors, weight gain or weight loss. The symptoms have been improving.     38 year old female patient with medical history as above. She is being engaged in follow-up after she was treated with radioactive iodine treatment for Graves' disease. She has dealt with hyperthyroidism from Graves' disease for 3+ years.  She received I-131 thyroid ablation treatment on August 05, 2019.    -Her prior thyroid ultrasound did not reveal any nodular lesions.  She denies any family history of thyroid dysfunction.  Review of systems  Constitutional: + increasing body weight,  current There is no height or weight on file to calculate BMI. , + fatigue, no subjective hyperthermia, no subjective hypothermia Eyes: no blurry vision, no xerophthalmia ENT: no sore throat, no nodules palpated in throat, no dysphagia/odynophagia, no hoarseness Cardiovascular: no chest pain, no shortness of breath, no palpitations, no leg swelling Respiratory: no cough, no shortness of breath Gastrointestinal: no nausea/vomiting/diarrhea Musculoskeletal: + diffuse muscle/joint aches Skin: no rashes, no hyperemia Neurological: no tremors, no numbness, no tingling, no dizziness Psychiatric: no depression, no anxiety     Objective:    There  were no vitals taken for this visit.  Wt Readings from Last 3 Encounters:  02/04/23 174 lb 9.6 oz (79.2 kg)  10/09/22 173 lb 6.4 oz (78.7 kg)  10/03/21 166 lb (75.3 kg)    BP Readings from Last 3 Encounters:  02/04/23 118/76  10/09/22 123/82  10/03/21 124/86    Physical Exam- Limited  Constitutional:  There is no height or weight on file to calculate BMI. ,  not in acute distress, normal state of mind Eyes:  EOMI, no exophthalmos Musculoskeletal: no gross deformities, strength intact in all four extremities, no gross restriction of joint movements Skin:  no rashes, no hyperemia Neurological: no tremor with outstretched hands   Recent Results (from the past 2160 hours)  T4, free     Status: None   Collection Time: 04/30/23  2:24 PM  Result Value Ref Range   Free T4 1.46 0.82 - 1.77 ng/dL  TSH     Status: None   Collection Time: 04/30/23  2:24 PM  Result Value Ref Range   TSH 1.940 0.450 - 4.500 uIU/mL  VITAMIN D 25 Hydroxy (Vit-D Deficiency, Fractures)     Status: None   Collection Time: 04/30/23  2:24 PM  Result Value Ref Range   Vit D, 25-Hydroxy 34.2 30.0 - 100.0 ng/mL    Comment: Vitamin D deficiency has been defined by the Institute of Medicine and an Endocrine Society practice guideline as a level of serum 25-OH vitamin D less than 20 ng/mL (1,2). The Endocrine Society went on to further define vitamin D insufficiency as a level between 21 and 29 ng/mL (2). 1. IOM (Institute of Medicine). 2010. Dietary reference    intakes for calcium and D. [place]: The    [employer]. 2. Holick MF, Binkley Widener, Bischoff-Ferrari HA, et al.    Evaluation, treatment, and prevention of vitamin D    deficiency: an Endocrine Society clinical practice    guideline. JCEM. 2011 Jul; 96(7):1911-30.     Latest Reference Range & Units 06/27/21 14:55 09/26/21 14:55 01/31/22 14:58 10/03/22 14:46 01/29/23 14:23 04/30/23 14:24  TSH 0.450 - 4.500 uIU/mL 0.028 (L) 3.530 1.720 7.140 (H) 12.800 (H) 1.940  T4,Free(Direct) 0.82 - 1.77 ng/dL 5.62 (H) 1.30 8.65 7.84 1.20 1.46  (L): Data is abnormally low (H): Data is abnormally high  Assessment & Plan:   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  -Her previsit thyroid function tests are consistent with appropriate hormone replacement   She is advised to continue her Levothyroxine 112 mcg po daily before breakfast..  Will recheck TFTs in 4 months and adjust dose further if needed.   - We discussed about the correct intake of her thyroid hormone, on empty stomach at fasting, with water, separated by at least 30 minutes from breakfast and other medications,  and separated by more than 4 hours from calcium, iron, multivitamins, acid reflux medications (PPIs). -Patient is made aware of the fact that thyroid hormone replacement is needed for life, dose to be adjusted by periodic monitoring of thyroid function tests.  2.  Vitamin D Deficiency -Her most recent vitamin D level was 34.2 on 04/30/23.  She stopped taking her OTC Vitamin D several weeks ago.  I encouraged her to restart OTC Vitamin D3 5000 units daily   - I advised patient to maintain close follow up with Jaclynn Guarneri, FNP for primary care needs.    I spent  22  minutes in the care of the patient today including review of labs from Thyroid Function, CMP, and other relevant labs ; imaging/biopsy records (current and previous including abstractions from other facilities); face-to-face time discussing  her lab results and symptoms, medications doses, her options of short and long term treatment based on the latest standards of care / guidelines;   and documenting the encounter.  Aliana Campoy  participated in the discussions, expressed understanding, and voiced agreement with the above plans.  All questions were answered to her satisfaction. she is encouraged to contact clinic should she have any questions or concerns prior to her return visit.   Follow up plan: No follow-ups on file.  Ronny Bacon, [facility] [address] [place], [place] [address] Phone: [phone] Fax: [phone]  05/08/2023, 2:05 PM

## 2023-09-09 LAB — T4, FREE: Free T4: 1.59 ng/dL (ref 0.82–1.77)

## 2023-09-09 LAB — TSH: TSH: 5.18 u[IU]/mL — ABNORMAL HIGH (ref 0.450–4.500)

## 2023-09-17 NOTE — Patient Instructions (Signed)

## 2023-09-18 ENCOUNTER — Encounter: Payer: Self-pay | Admitting: Nurse Practitioner

## 2023-09-18 ENCOUNTER — Ambulatory Visit (INDEPENDENT_AMBULATORY_CARE_PROVIDER_SITE_OTHER): Payer: PRIVATE HEALTH INSURANCE | Admitting: Nurse Practitioner

## 2023-09-18 VITALS — BP 128/78 | HR 100 | Ht 62.0 in | Wt 172.6 lb

## 2023-09-18 DIAGNOSIS — E890 Postprocedural hypothyroidism: Secondary | ICD-10-CM

## 2023-09-18 DIAGNOSIS — E559 Vitamin D deficiency, unspecified: Secondary | ICD-10-CM | POA: Diagnosis not present

## 2023-09-18 MED ORDER — LEVOTHYROXINE SODIUM 125 MCG PO TABS
125.0000 ug | ORAL_TABLET | Freq: Every day | ORAL | 3 refills | Status: AC
Start: 2023-09-18 — End: ?

## 2023-09-18 NOTE — Progress Notes (Signed)
 09/18/2023  Endocrinology Follow Up Visit    Subjective:    Patient ID: Kara Bruce, female    DOB: 1985-06-24, PCP Detra Lang BROCKS, FNP   Past Medical History:  Diagnosis Date   Hyperthyroidism    History reviewed. No pertinent surgical history. Social History   Socioeconomic History   Marital status: Single    Spouse name: Not on file   Number of children: Not on file   Years of education: Not on file   Highest education level: Not on file  Occupational History   Not on file  Tobacco Use   Smoking status: Never   Smokeless tobacco: Never  Vaping Use   Vaping status: Never Used  Substance and Sexual Activity   Alcohol use: No   Drug use: No   Sexual activity: Not Currently    Birth control/protection: Abstinence  Other Topics Concern   Not on file  Social History Narrative   Not on file   Social Drivers of Health   Financial Resource Strain: Not on file  Food Insecurity: Not on file  Transportation Needs: Not on file  Physical Activity: Not on file  Stress: Not on file  Social Connections: Not on file   Outpatient Encounter Medications as of 09/18/2023  Medication Sig   famotidine (PEPCID) 40 MG tablet Take 40 mg by mouth at bedtime.   JUNEL FE 1/20 1-20 MG-MCG tablet Take 1 tablet by mouth daily.   [DISCONTINUED] levothyroxine  (SYNTHROID ) 112 MCG tablet Take 1 tablet (112 mcg total) by mouth daily before breakfast.   levothyroxine  (SYNTHROID ) 125 MCG tablet Take 1 tablet (125 mcg total) by mouth daily before breakfast.   [DISCONTINUED] Cholecalciferol (VITAMIN D3 PO) Take 1 tablet by mouth daily in the afternoon. (Patient not taking: Reported on 09/18/2023)   [DISCONTINUED] FERRETTS 325 (106 Fe) MG TABS tablet Take 1 tablet by mouth daily. (Patient not taking: Reported on 09/18/2023)   [DISCONTINUED] ferrous sulfate 325 (65 FE) MG EC tablet Take 325 mg by mouth. Patient states that she only takes this 1 to 2 times weekly. (Patient not taking: Reported on  09/18/2023)   [DISCONTINUED] Vitamin D , Ergocalciferol , (DRISDOL ) 1.25 MG (50000 UNIT) CAPS capsule TAKE 1 CAPSULE (50,000 UNITS TOTAL) BY MOUTH EVERY 7 (SEVEN) DAYS (Patient not taking: Reported on 09/18/2023)   No facility-administered encounter medications on file as of 09/18/2023.   ALLERGIES: No Known Allergies  VACCINATION STATUS:  There is no immunization history on file for this patient.  Thyroid  Problem Presents for follow-up visit. Symptoms include menstrual problem (irregular menses with prolonged bleeding- just started Junel). Patient reports no anxiety, cold intolerance, constipation, depressed mood, fatigue, heat intolerance, palpitations, tremors, weight gain or weight loss. The symptoms have been improving.     38 year old female patient with medical history as above. She is being engaged in follow-up after she was treated with radioactive iodine treatment for Graves' disease. She has dealt with hyperthyroidism from Graves' disease for 3+ years.  She received I-131 thyroid  ablation treatment on August 05, 2019.    -Her prior thyroid  ultrasound did not reveal any nodular lesions.  She denies any family history of thyroid  dysfunction.  Review of systems  Constitutional: + increasing body weight,  current Body mass index is 31.57 kg/m. , + fatigue, no subjective hyperthermia, no subjective hypothermia Eyes: no blurry vision, no xerophthalmia ENT: no sore throat, no nodules palpated in throat, no dysphagia/odynophagia, no hoarseness Cardiovascular: no chest pain, no shortness of breath, no palpitations, no leg  swelling Respiratory: no cough, no shortness of breath Gastrointestinal: no nausea/vomiting/diarrhea Musculoskeletal: + diffuse muscle/joint aches Skin: no rashes, no hyperemia Neurological: no tremors, no numbness, no tingling, no dizziness Psychiatric: no depression, no anxiety     Objective:    BP 128/78 (BP Location: Right Arm, Patient Position: Sitting, Cuff  Size: Large)   Pulse 100   Ht 5' 2 (1.575 m)   Wt 172 lb 9.6 oz (78.3 kg)   BMI 31.57 kg/m   Wt Readings from Last 3 Encounters:  09/18/23 172 lb 9.6 oz (78.3 kg)  05/08/23 169 lb 3.2 oz (76.7 kg)  02/04/23 174 lb 9.6 oz (79.2 kg)    BP Readings from Last 3 Encounters:  09/18/23 128/78  05/08/23 110/82  02/04/23 118/76     Physical Exam- Limited  Constitutional:  Body mass index is 31.57 kg/m. , not in acute distress, normal state of mind Eyes:  EOMI, no exophthalmos Musculoskeletal: no gross deformities, strength intact in all four extremities, no gross restriction of joint movements Skin:  no rashes, no hyperemia Neurological: no tremor with outstretched hands   Recent Results (from the past 2160 hours)  TSH     Status: Abnormal   Collection Time: 09/08/23  8:26 AM  Result Value Ref Range   TSH 5.180 (H) 0.450 - 4.500 uIU/mL  T4, free     Status: None   Collection Time: 09/08/23  8:26 AM  Result Value Ref Range   Free T4 1.59 0.82 - 1.77 ng/dL    Latest Reference Range & Units 01/31/22 14:58 10/03/22 14:46 01/29/23 14:23 04/30/23 14:24 09/08/23 08:26  TSH 0.450 - 4.500 uIU/mL 1.720 7.140 (H) 12.800 (H) 1.940 5.180 (H)  T4,Free(Direct) 0.82 - 1.77 ng/dL 8.50 8.79 8.79 8.53 8.40  (H): Data is abnormally high  Assessment & Plan:   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  -Her previsit thyroid  function tests are consistent with slight under-replacement.  She is advised to increase her Levothyroxine  to 125 mcg po daily before breakfast..  Will recheck TFTs in 4 months and adjust dose further if needed.   - We discussed about the correct intake of her thyroid  hormone, on empty stomach at fasting, with water, separated by at least 30 minutes from breakfast and other medications,  and separated by more than 4 hours from calcium, iron, multivitamins, acid reflux medications (PPIs). -Patient is made aware of the fact  that thyroid  hormone replacement is needed for life, dose to be adjusted by periodic monitoring of thyroid  function tests.  2.  Vitamin D  Deficiency -Her most recent vitamin D  level was 34.2 on 04/30/23.   I encouraged her to continue OTC Vitamin D3 5000 units daily-she admits she has not been consistent with this.  Will recheck vitamin D  prior to next visit.   - I advised patient to maintain close follow up with Beach, Jacob C, FNP for primary care needs.   I spent  17  minutes in the care of the patient today including review of labs from Thyroid  Function, CMP, and other relevant labs ; imaging/biopsy records (current and previous including abstractions from other facilities); face-to-face time discussing  her lab results and symptoms, medications doses, her options of short and long term treatment based on the latest standards of care / guidelines;   and documenting the encounter.  Sae Rumler  participated in the discussions, expressed understanding, and voiced agreement with the above plans.  All  questions were answered to her satisfaction. she is encouraged to contact clinic should she have any questions or concerns prior to her return visit.   Follow up plan: Return in about 4 months (around 01/18/2024) for Thyroid  follow up, Previsit labs.  Benton Rio, [facility] [address] [place], [place] [address] Phone: [phone] Fax: [phone]  09/18/2023, 3:17 PM

## 2024-01-04 ENCOUNTER — Other Ambulatory Visit: Payer: Self-pay

## 2024-01-04 ENCOUNTER — Encounter (HOSPITAL_COMMUNITY): Payer: Self-pay

## 2024-01-04 ENCOUNTER — Emergency Department (HOSPITAL_COMMUNITY)
Admission: EM | Admit: 2024-01-04 | Discharge: 2024-01-04 | Disposition: A | Discharge status: Home / Self Care | Payer: PRIVATE HEALTH INSURANCE | Attending: Emergency Medicine | Admitting: Emergency Medicine

## 2024-01-04 DIAGNOSIS — N751 Abscess of Bartholin's gland: Secondary | ICD-10-CM | POA: Insufficient documentation

## 2024-01-04 DIAGNOSIS — Z79899 Other long term (current) drug therapy: Secondary | ICD-10-CM | POA: Diagnosis not present

## 2024-01-04 MED ORDER — DOXYCYCLINE HYCLATE 100 MG PO CAPS: 100.0000 mg | ORAL_CAPSULE | Freq: Two times a day (BID) | ORAL | 0 refills | Status: AC

## 2024-01-04 MED ORDER — HYDROCODONE-ACETAMINOPHEN 5-325 MG PO TABS: 1.0000 | ORAL_TABLET | Freq: Four times a day (QID) | ORAL | 0 refills | Status: AC | PRN

## 2024-01-04 MED ORDER — LIDOCAINE HCL (PF) 1 % IJ SOLN
30.0000 mL | Freq: Once | INTRAMUSCULAR | Status: AC
  Administered 2024-01-04: 30 mL
  Filled 2024-01-04: qty 30

## 2024-01-04 MED ORDER — DOXYCYCLINE HYCLATE 100 MG PO TABS
100.0000 mg | ORAL_TABLET | Freq: Once | ORAL | Status: AC
  Administered 2024-01-04: 100 mg via ORAL
  Filled 2024-01-04: qty 1

## 2024-01-04 MED ORDER — HYDROCODONE-ACETAMINOPHEN 5-325 MG PO TABS
1.0000 | ORAL_TABLET | Freq: Once | ORAL | Status: AC
  Administered 2024-01-04: 1 via ORAL
  Filled 2024-01-04: qty 1

## 2024-01-04 NOTE — ED Triage Notes (Signed)
 Patient presents via POV with family. c/o cyst to right side of groin. Patient reports  cyst developed on Wednesday  night, tender to touch. Patient states the pain continues to get worse.

## 2024-01-04 NOTE — Discharge Instructions (Addendum)
 Today for a Bartholin's abscess.  We drained this here and placed a Word catheter to allow it to continue draining.  Keep the area clean and dry, take the antibiotics as prescribed and call your OB/GYN office tomorrow to schedule appointment follow-up in 2 to 3 days.  Come back to the ER for any worsening symptoms.

## 2024-01-04 NOTE — ED Provider Notes (Signed)
 [place] EMERGENCY DEPARTMENT AT [facility] Provider Note   CSN: [id number] Arrival date & time: 01/04/24  1441     Patient presents with: vulvar abscess   Kara Bruce is a 38 y.o. female.  Here for right labial swelling x 3 to 4 days, not improved with drawing salve, warm compresses or sitz bath's.  She has had this in the past and had to have drainage.  Denies fever or chills, no concern for STI.   HPI     Prior to Admission medications   Medication Sig Start Date End Date Taking? Authorizing Provider  famotidine (PEPCID) 40 MG tablet Take 40 mg by mouth at bedtime.    [provider]  JUNEL FE 1/20 1-20 MG-MCG tablet Take 1 tablet by mouth daily. 06/25/21   [provider]  levothyroxine  (SYNTHROID ) 125 MCG tablet Take 1 tablet (125 mcg total) by mouth daily before breakfast. 09/18/23   Therisa Benton PARAS, NP    Allergies: Patient has no known allergies.    Review of Systems  Updated Vital Signs BP 133/84 (BP Location: Right Arm)   Pulse 80   Temp 98 F (36.7 C)   Resp 17   Ht 5' 2 (1.575 m)   Wt 79.8 kg   LMP 12/24/2023 (Approximate)   SpO2 98%   BMI 32.19 kg/m   Physical Exam Vitals and nursing note reviewed.  Constitutional:      General: She is not in acute distress.    Appearance: She is well-developed.  HENT:     Head: Normocephalic and atraumatic.     Mouth/Throat:     Mouth: Mucous membranes are moist.  Eyes:     Extraocular Movements: Extraocular movements intact.     Conjunctiva/sclera: Conjunctivae normal.     Pupils: Pupils are equal, round, and reactive to light.  Cardiovascular:     Rate and Rhythm: Normal rate and regular rhythm.     Heart sounds: No murmur heard. Pulmonary:     Effort: Pulmonary effort is normal. No respiratory distress.     Breath sounds: Normal breath sounds.  Abdominal:     Palpations: Abdomen is soft.     Tenderness: There is no abdominal tenderness.  Genitourinary:     Comments: Right Bartholin's gland and is very swollen and tender no other genital swelling or tenderness.  Exam chaperoned by RN Kenya Musculoskeletal:        General: No swelling.     Cervical back: Neck supple.  Skin:    General: Skin is warm and dry.     Capillary Refill: Capillary refill takes less than 2 seconds.  Neurological:     General: No focal deficit present.     Mental Status: She is alert and oriented to person, place, and time.  Psychiatric:        Mood and Affect: Mood normal.     (all labs ordered are listed, but only abnormal results are displayed) Labs Reviewed - No data to display  EKG: None  Radiology: No results found.   .Incision and Drainage  Date/Time: 01/04/2024 7:38 PM  Performed by: Suellen Sherran LABOR, PA-C Authorized by: Suellen Sherran LABOR, PA-C   Consent:    Consent obtained:  Verbal   Consent given by:  Patient   Risks discussed:  Bleeding, incomplete drainage, pain and damage to other organs Universal protocol:    Patient identity confirmed:  Verbally with patient Location:    Type:  Bartholin cyst  Location: Right Bartholin's gland. Pre-procedure details:    Skin preparation:  Povidone-iodine Sedation:    Sedation type:  None Anesthesia:    Anesthesia method:  Local infiltration   Local anesthetic:  Lidocaine  1% w/o epi Procedure type:    Complexity:  Simple Procedure details:    Ultrasound guidance: no     Needle aspiration: no     Incision types:  Single straight   Incision depth:  Submucosal   Wound management:  Irrigated with saline and probed and deloculated   Drainage:  Purulent   Drainage amount:  Copious   Packing materials:  Word catheter Post-procedure details:    Procedure completion:  Tolerated    Medications Ordered in the ED - No data to display                                  Medical Decision Making Risk Prescription drug management.   Differential diagnosis includes but not limited to cutaneous  abscess, Bartholin's abscess, Fournier's gangrene, other  ED course: Patient here with a right Bartholin's abscess, she has had this once in the past.  She did not get relief at home with warm compresses or topical medications.  She is agreeable with I&D here which she performed and she has significant relief, she given Norco for pain and started on doxycycline.  [facility] [place]  and is going to call tomorrow for close follow-up.  Discussed that if this becomes recurrent problem she may need procedure such as marsupialization.  Not concerned about STIs.  Given strict return precautions.     Final diagnoses:  None    ED Discharge Orders     None          Suellen Sherran DELENA DEVONNA 01/04/24 1940    Towana Ozell BROCKS, MD 01/05/24 1012

## 2024-01-19 ENCOUNTER — Ambulatory Visit: Payer: PRIVATE HEALTH INSURANCE | Admitting: Nurse Practitioner

## 2024-01-19 DIAGNOSIS — E890 Postprocedural hypothyroidism: Secondary | ICD-10-CM

## 2024-01-19 DIAGNOSIS — E559 Vitamin D deficiency, unspecified: Secondary | ICD-10-CM

## 2024-04-16 ENCOUNTER — Ambulatory Visit: Payer: PRIVATE HEALTH INSURANCE | Admitting: Nurse Practitioner
